# Patient Record
Sex: Male | Born: 1995 | Race: Black or African American | Hispanic: No | Marital: Single
Health system: Southern US, Community
[De-identification: ages and names within clinical notes are randomized; demographics above are authoritative.]

## PROBLEM LIST (undated history)

## (undated) HISTORY — PX: WISDOM TOOTH EXTRACTION: SHX21

## (undated) HISTORY — PX: APPENDECTOMY: SHX54

---
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
## 2016-02-23 ENCOUNTER — Encounter (HOSPITAL_COMMUNITY): Payer: Self-pay | Admitting: Emergency Medicine

## 2016-02-23 ENCOUNTER — Ambulatory Visit (HOSPITAL_COMMUNITY)
Admission: EM | Admit: 2016-02-23 | Discharge: 2016-02-23 | Disposition: A | Discharge status: Home / Self Care | Payer: Medicaid Other | Attending: Family Medicine | Admitting: Family Medicine

## 2016-02-23 ENCOUNTER — Ambulatory Visit (INDEPENDENT_AMBULATORY_CARE_PROVIDER_SITE_OTHER): Payer: Medicaid Other

## 2016-02-23 DIAGNOSIS — J069 Acute upper respiratory infection, unspecified: Secondary | ICD-10-CM

## 2016-02-23 DIAGNOSIS — B9789 Other viral agents as the cause of diseases classified elsewhere: Secondary | ICD-10-CM

## 2016-02-23 DIAGNOSIS — J40 Bronchitis, not specified as acute or chronic: Secondary | ICD-10-CM | POA: Diagnosis not present

## 2016-02-23 MED ORDER — IPRATROPIUM BROMIDE 0.02 % IN SOLN
RESPIRATORY_TRACT | Status: AC
  Filled 2016-02-23: qty 2.5

## 2016-02-23 MED ORDER — ALBUTEROL SULFATE (2.5 MG/3ML) 0.083% IN NEBU
5.0000 mg | INHALATION_SOLUTION | Freq: Once | RESPIRATORY_TRACT | Status: AC
  Administered 2016-02-23: 5 mg via RESPIRATORY_TRACT

## 2016-02-23 MED ORDER — ALBUTEROL SULFATE HFA 108 (90 BASE) MCG/ACT IN AERS: 1.0000 | INHALATION_SPRAY | Freq: Four times a day (QID) | RESPIRATORY_TRACT | 0 refills | Status: DC | PRN

## 2016-02-23 MED ORDER — IPRATROPIUM BROMIDE 0.06 % NA SOLN: 2.0000 | Freq: Four times a day (QID) | NASAL | 12 refills | Status: DC

## 2016-02-23 MED ORDER — ALBUTEROL SULFATE (2.5 MG/3ML) 0.083% IN NEBU
INHALATION_SOLUTION | RESPIRATORY_TRACT | Status: AC
  Filled 2016-02-23: qty 6

## 2016-02-23 MED ORDER — IPRATROPIUM BROMIDE 0.02 % IN SOLN
0.5000 mg | Freq: Once | RESPIRATORY_TRACT | Status: AC
  Administered 2016-02-23: 0.5 mg via RESPIRATORY_TRACT

## 2016-02-23 NOTE — ED Provider Notes (Signed)
CSN: [id number]     Arrival date & time 02/23/16  1013 History   First MD Initiated Contact with Patient 02/23/16 1152     Chief Complaint  Patient presents with  . Cough   (Consider location/radiation/quality/duration/timing/severity/associated sxs/prior Treatment) The history is provided by the patient.  Cough  Cough characteristics:  Productive, nocturnal and hoarse Sputum characteristics:  Yellow Severity:  Moderate Onset quality:  Gradual Duration:  3 days Timing:  Intermittent Progression:  Worsening Chronicity:  Recurrent Smoker: yes   Context: sick contacts and upper respiratory infection   Relieved by:  None tried Worsened by:  Lying down Ineffective treatments:  None tried Associated symptoms: chills, fever, rhinorrhea, shortness of breath, sinus congestion and wheezing   Associated symptoms: no ear pain, no eye discharge and no sore throat   Subjective fevers. Pt admits to h/o recurrent bronchitis. Pt is also a smoker.   History reviewed. No pertinent past medical history. Past Surgical History:  Procedure Laterality Date  . APPENDECTOMY    . APPENDECTOMY    . WISDOM TOOTH EXTRACTION     History reviewed. No pertinent family history. Social History  Substance Use Topics  . Smoking status: Passive Smoke Exposure - Never Smoker  . Smokeless tobacco: Never Used  . Alcohol use No    Review of Systems  Constitutional: Positive for chills and fever. Negative for activity change and appetite change.  HENT: Positive for congestion, dental problem, rhinorrhea and sneezing. Negative for ear discharge, ear pain, facial swelling and sore throat.   Eyes: Negative for discharge.  Respiratory: Positive for cough, shortness of breath and wheezing. Negative for chest tightness.   Gastrointestinal: Positive for vomiting.  Endocrine: Negative.   Genitourinary: Negative.   Psychiatric/Behavioral: Negative.     Allergies  Patient has no known allergies.  Home Medications    Prior to Admission medications   Medication Sig Start Date End Date Taking? Authorizing Provider  albuterol (PROVENTIL HFA;VENTOLIN HFA) 108 (90 Base) MCG/ACT inhaler Inhale 1-2 puffs into the lungs every 6 (six) hours as needed for wheezing or shortness of breath (cough). 02/23/16   Roma KayserKatherine P Hildegard Hlavac, NP  amoxicillin (AMOXIL) 500 MG capsule Take 1 capsule (500 mg total) by mouth 3 (three) times daily. 05/20/14   Marcellina Millinimothy Galey, MD  DiphenhydrAMINE HCl (BENADRYL PO) Take by mouth.    Historical Provider, MD  HYDROcodone-acetaminophen (NORCO/VICODIN) 5-325 MG per tablet Take 1 tablet by mouth every 6 (six) hours as needed for moderate pain.    Historical Provider, MD  hydrOXYzine (ATARAX/VISTARIL) 25 MG tablet Take 1 tablet (25 mg total) by mouth every 6 (six) hours. For itching 09/01/12   Linna HoffJames D Kindl, MD  ipratropium (ATROVENT) 0.06 % nasal spray Place 2 sprays into both nostrils 4 (four) times daily. 02/23/16   Roma KayserKatherine P Ernestine Rohman, NP  predniSONE (DELTASONE) 20 MG tablet Take 3 tablets (60 mg total) by mouth daily. 04/27/15   Santiago GladHeather Laisure, PA-C   Meds Ordered and Administered this Visit   Medications  albuterol (PROVENTIL) (2.5 MG/3ML) 0.083% nebulizer solution 5 mg (5 mg Nebulization Given 02/23/16 1223)  ipratropium (ATROVENT) nebulizer solution 0.5 mg (0.5 mg Nebulization Given 02/23/16 1223)    BP 135/67 (BP Location: Left Arm)   Pulse 98   Temp 98.4 F (36.9 C) (Oral)   Resp 16   SpO2 98%  No data found.   Physical Exam  Constitutional: He is oriented to person, place, and time. He appears well-developed and well-nourished.  HENT:  Head:  Normocephalic and atraumatic.  Right Ear: Tympanic membrane normal.  Left Ear: Tympanic membrane normal.  Nose: Rhinorrhea present.  Mouth/Throat: Uvula is midline, oropharynx is clear and moist and mucous membranes are normal.  Eyes: Conjunctivae are normal.  Cardiovascular: Normal rate.   Pulmonary/Chest: No respiratory distress. He has  wheezes.  Cough w/ deep inspiration.  Lymphadenopathy:    He has cervical adenopathy.  Neurological: He is alert and oriented to person, place, and time.  Skin: Skin is warm and dry.  Psychiatric: He has a normal mood and affect.    Urgent Care Course   Clinical Course     Procedures (including critical care time)  Labs Review Labs Reviewed - No data to display  Imaging Review Dg Chest 2 View  Result Date: 02/23/2016 CLINICAL DATA:  Two day history of cough and chest congestion. Wheezing. EXAM: CHEST  2 VIEW COMPARISON:  Two-view chest x-ray 04/27/2015 FINDINGS: The heart size and mediastinal contours are within normal limits. Both lungs are clear. The visualized skeletal structures are unremarkable. IMPRESSION: Negative two view chest x-ray Electronically Signed   By: Marin Robertshristopher  Mattern M.D.   On: 02/23/2016 12:21     Visual Acuity Review  Right Eye Distance:   Left Eye Distance:   Bilateral Distance:    Right Eye Near:   Left Eye Near:    Bilateral Near:         MDM   1. Bronchitis   2. Viral upper respiratory tract infection   Significant insp/exp wheezes on exam. Much improved after alb/atrovent neb. CCR negative for acute findings. Albuterol inhlaer Intranasal Atrovent Home care and smoking cessation discussed and provided in print.     Leanne ChangKatherine P Lonia Roane, NP 02/23/16 1324

## 2016-02-23 NOTE — ED Triage Notes (Signed)
[facility] a complaint of a cough that is productive and chest congestion that is worse at night that all started 3 days ago.

## 2016-07-06 ENCOUNTER — Emergency Department (HOSPITAL_COMMUNITY)
Admission: EM | Admit: 2016-07-06 | Discharge: 2016-07-06 | Disposition: A | Discharge status: Home / Self Care | Payer: Medicaid Other | Attending: Emergency Medicine | Admitting: Emergency Medicine

## 2016-07-06 ENCOUNTER — Encounter (HOSPITAL_COMMUNITY): Payer: Self-pay

## 2016-07-06 DIAGNOSIS — Z7722 Contact with and (suspected) exposure to environmental tobacco smoke (acute) (chronic): Secondary | ICD-10-CM | POA: Insufficient documentation

## 2016-07-06 DIAGNOSIS — R112 Nausea with vomiting, unspecified: Secondary | ICD-10-CM | POA: Insufficient documentation

## 2016-07-06 DIAGNOSIS — R197 Diarrhea, unspecified: Secondary | ICD-10-CM | POA: Insufficient documentation

## 2016-07-06 LAB — COMPREHENSIVE METABOLIC PANEL
ALT: 18 U/L (ref 17–63)
ANION GAP: 8 (ref 5–15)
AST: 23 U/L (ref 15–41)
Albumin: 4.4 g/dL (ref 3.5–5.0)
Alkaline Phosphatase: 69 U/L (ref 38–126)
BILIRUBIN TOTAL: 1.4 mg/dL — AB (ref 0.3–1.2)
BUN: 7 mg/dL (ref 6–20)
CO2: 26 mmol/L (ref 22–32)
Calcium: 9.5 mg/dL (ref 8.9–10.3)
Chloride: 104 mmol/L (ref 101–111)
Creatinine, Ser: 1.05 mg/dL (ref 0.61–1.24)
GFR calc Af Amer: >60 mL/min (ref >60)
GFR calc non Af Amer: >60 mL/min (ref >60)
GLUCOSE: 93 mg/dL (ref 65–99)
POTASSIUM: 4 mmol/L (ref 3.5–5.1)
Sodium: 138 mmol/L (ref 135–145)
Total Protein: 7.1 g/dL (ref 6.5–8.1)

## 2016-07-06 LAB — LIPASE, BLOOD: LIPASE: 27 U/L (ref 11–51)

## 2016-07-06 LAB — CBC
HCT: 47.4 % (ref 39.0–52.0)
Hemoglobin: 16.3 g/dL (ref 13.0–17.0)
MCH: 31.1 pg (ref 26.0–34.0)
MCHC: 34.4 g/dL (ref 30.0–36.0)
MCV: 90.5 fL (ref 78.0–100.0)
PLATELETS: 247 10*3/uL (ref 150–400)
RBC: 5.24 MIL/uL (ref 4.22–5.81)
RDW: 12.5 % (ref 11.5–15.5)
WBC: 4.9 10*3/uL (ref 4.0–10.5)

## 2016-07-06 MED ORDER — ONDANSETRON 4 MG PO TBDP: 4.0000 mg | ORAL_TABLET | Freq: Three times a day (TID) | ORAL | 0 refills | Status: DC | PRN

## 2016-07-06 MED ORDER — ONDANSETRON 4 MG PO TBDP
4.0000 mg | ORAL_TABLET | Freq: Once | ORAL | Status: AC
  Administered 2016-07-06: 4 mg via ORAL
  Filled 2016-07-06: qty 1

## 2016-07-06 NOTE — ED Triage Notes (Signed)
Patient here with lower abdominal cramping, diarrhea, generalized headache x 1 day. No nausea, no vomiting, NAD

## 2016-07-06 NOTE — ED Provider Notes (Signed)
[facility]-EMERGENCY DEPT Provider Note   CSN: [id number] Arrival date & time: 07/06/16  4010     History   Chief Complaint Chief Complaint  Patient presents with  . Headache  . Diarrhea    HPI Ryan Hodges is a 21 y.o. male.  21 yo M with a chief complaint of nausea vomiting and diarrhea. Started yesterday. Patient denies suspicious food intake. Subjective fevers at home. Mild headache with this as well. Has been able tolerate by mouth without difficulty. Diffuse crampy abdominal pain. His mother gave him a glass of prune juice which made her cramping worse.   The history is provided by the patient.  Headache   Associated symptoms include nausea and vomiting. Pertinent negatives include no fever, no palpitations and no shortness of breath.  Diarrhea   Associated symptoms include vomiting. Pertinent negatives include no abdominal pain, no chills, no headaches, no arthralgias and no myalgias.  Illness  This is a new problem. The current episode started yesterday. The problem occurs constantly. The problem has not changed since onset.Pertinent negatives include no chest pain, no abdominal pain, no headaches and no shortness of breath. Nothing aggravates the symptoms. Nothing relieves the symptoms. He has tried nothing for the symptoms. The treatment provided no relief.    History reviewed. No pertinent past medical history.  Patient Active Problem List   Diagnosis Date Noted  . ALLERGIC RHINITIS, SEASONAL 06/29/2008  . OBESITY, NOS 05/15/2006    Past Surgical History:  Procedure Laterality Date  . APPENDECTOMY    . APPENDECTOMY    . WISDOM TOOTH EXTRACTION         Home Medications    Prior to Admission medications   Medication Sig Start Date End Date Taking? Authorizing Provider  albuterol (PROVENTIL HFA;VENTOLIN HFA) 108 (90 Base) MCG/ACT inhaler Inhale 1-2 puffs into the lungs every 6 (six) hours as needed for wheezing or shortness of breath (cough). 02/23/16    Roma Kayser Schorr, NP  amoxicillin (AMOXIL) 500 MG capsule Take 1 capsule (500 mg total) by mouth 3 (three) times daily. 05/20/14   Marcellina Millin, MD  DiphenhydrAMINE HCl (BENADRYL PO) Take by mouth.    Historical Provider, MD  HYDROcodone-acetaminophen (NORCO/VICODIN) 5-325 MG per tablet Take 1 tablet by mouth every 6 (six) hours as needed for moderate pain.    Historical Provider, MD  hydrOXYzine (ATARAX/VISTARIL) 25 MG tablet Take 1 tablet (25 mg total) by mouth every 6 (six) hours. For itching 09/01/12   Linna Hoff, MD  ipratropium (ATROVENT) 0.06 % nasal spray Place 2 sprays into both nostrils 4 (four) times daily. 02/23/16   Roma Kayser Schorr, NP  ondansetron (ZOFRAN ODT) 4 MG disintegrating tablet Take 1 tablet (4 mg total) by mouth every 8 (eight) hours as needed for nausea or vomiting. 07/06/16   Melene Plan, DO  predniSONE (DELTASONE) 20 MG tablet Take 3 tablets (60 mg total) by mouth daily. 04/27/15   Santiago Glad, PA-C    Family History No family history on file.  Social History Social History  Substance Use Topics  . Smoking status: Passive Smoke Exposure - Never Smoker  . Smokeless tobacco: Never Used  . Alcohol use No     Allergies   Patient has no known allergies.   Review of Systems Review of Systems  Constitutional: Negative for chills and fever.  HENT: Negative for congestion and facial swelling.   Eyes: Negative for discharge and visual disturbance.  Respiratory: Negative for shortness of breath.  Cardiovascular: Negative for chest pain and palpitations.  Gastrointestinal: Positive for diarrhea, nausea and vomiting. Negative for abdominal pain.  Musculoskeletal: Negative for arthralgias and myalgias.  Skin: Negative for color change and rash.  Neurological: Negative for tremors, syncope and headaches.  Psychiatric/Behavioral: Negative for confusion and dysphoric mood.     Physical Exam Updated Vital Signs BP 126/77   Pulse 80   Temp 97.9 F (36.6 C)  (Oral)   Resp 18   SpO2 100%   Physical Exam  Constitutional: He is oriented to person, place, and time. He appears well-developed and well-nourished.  HENT:  Head: Normocephalic and atraumatic.  Eyes: EOM are normal. Pupils are equal, round, and reactive to light.  Neck: Normal range of motion. Neck supple. No JVD present.  Cardiovascular: Normal rate and regular rhythm.  Exam reveals no gallop and no friction rub.   No murmur heard. Pulmonary/Chest: No respiratory distress. He has no wheezes.  Abdominal: He exhibits no distension and no mass. There is tenderness (mild diffuse). There is no rebound and no guarding.  Musculoskeletal: Normal range of motion.  Neurological: He is alert and oriented to person, place, and time.  Skin: No rash noted. No pallor.  Psychiatric: He has a normal mood and affect. His behavior is normal.  Nursing note and vitals reviewed.    ED Treatments / Results  Labs (all labs ordered are listed, but only abnormal results are displayed) Labs Reviewed  COMPREHENSIVE METABOLIC PANEL - Abnormal; Notable for the following:       Result Value   Total Bilirubin 1.4 (*)    All other components within normal limits  LIPASE, BLOOD  CBC    EKG  EKG Interpretation None       Radiology No results found.  Procedures Procedures (including critical care time)  Medications Ordered in ED Medications  ondansetron (ZOFRAN-ODT) disintegrating tablet 4 mg (not administered)     Initial Impression / Assessment and Plan / ED Course  I have reviewed the triage vital signs and the nursing notes.  Pertinent labs & imaging results that were available during my care of the patient were reviewed by me and considered in my medical decision making (see chart for details).     21 yo M With a chief complaint of nausea vomiting and diarrhea. Nonfocal abdominal exam. Will obtain labs give Zofran. Suspect likely viral illness will DC home with prescription for Zofran  and Imodium  10:12 AM:  I have discussed the diagnosis/risks/treatment options with the patient and believe the pt to be eligible for discharge home to follow-up with PCP. We also discussed returning to the ED immediately if new or worsening sx occur. We discussed the sx which are most concerning (e.g., sudden worsening pain, fever, inability to tolerate by mouth) that necessitate immediate return. Medications administered to the patient during their visit and any new prescriptions provided to the patient are listed below.  Medications given during this visit Medications  ondansetron (ZOFRAN-ODT) disintegrating tablet 4 mg (not administered)     The patient appears reasonably screen and/or stabilized for discharge and I doubt any other medical condition or other [facility] requiring further screening, evaluation, or treatment in the ED at this time prior to discharge.    Final Clinical Impressions(s) / ED Diagnoses   Final diagnoses:  Nausea vomiting and diarrhea    New Prescriptions New Prescriptions   ONDANSETRON (ZOFRAN ODT) 4 MG DISINTEGRATING TABLET    Take 1 tablet (4 mg total) by mouth  every 8 (eight) hours as needed for nausea or vomiting.     Melene Plan, DO 07/06/16 1012

## 2016-09-29 IMAGING — DX DG CHEST 2V
2 series · 2 of 2 positions shown · non-contrast
Comparison: 09/28/2013

CLINICAL DATA: Cough, congestion, fever, and chest pain for 2 days

EXAM:
CHEST  2 VIEW

[w chest pa]
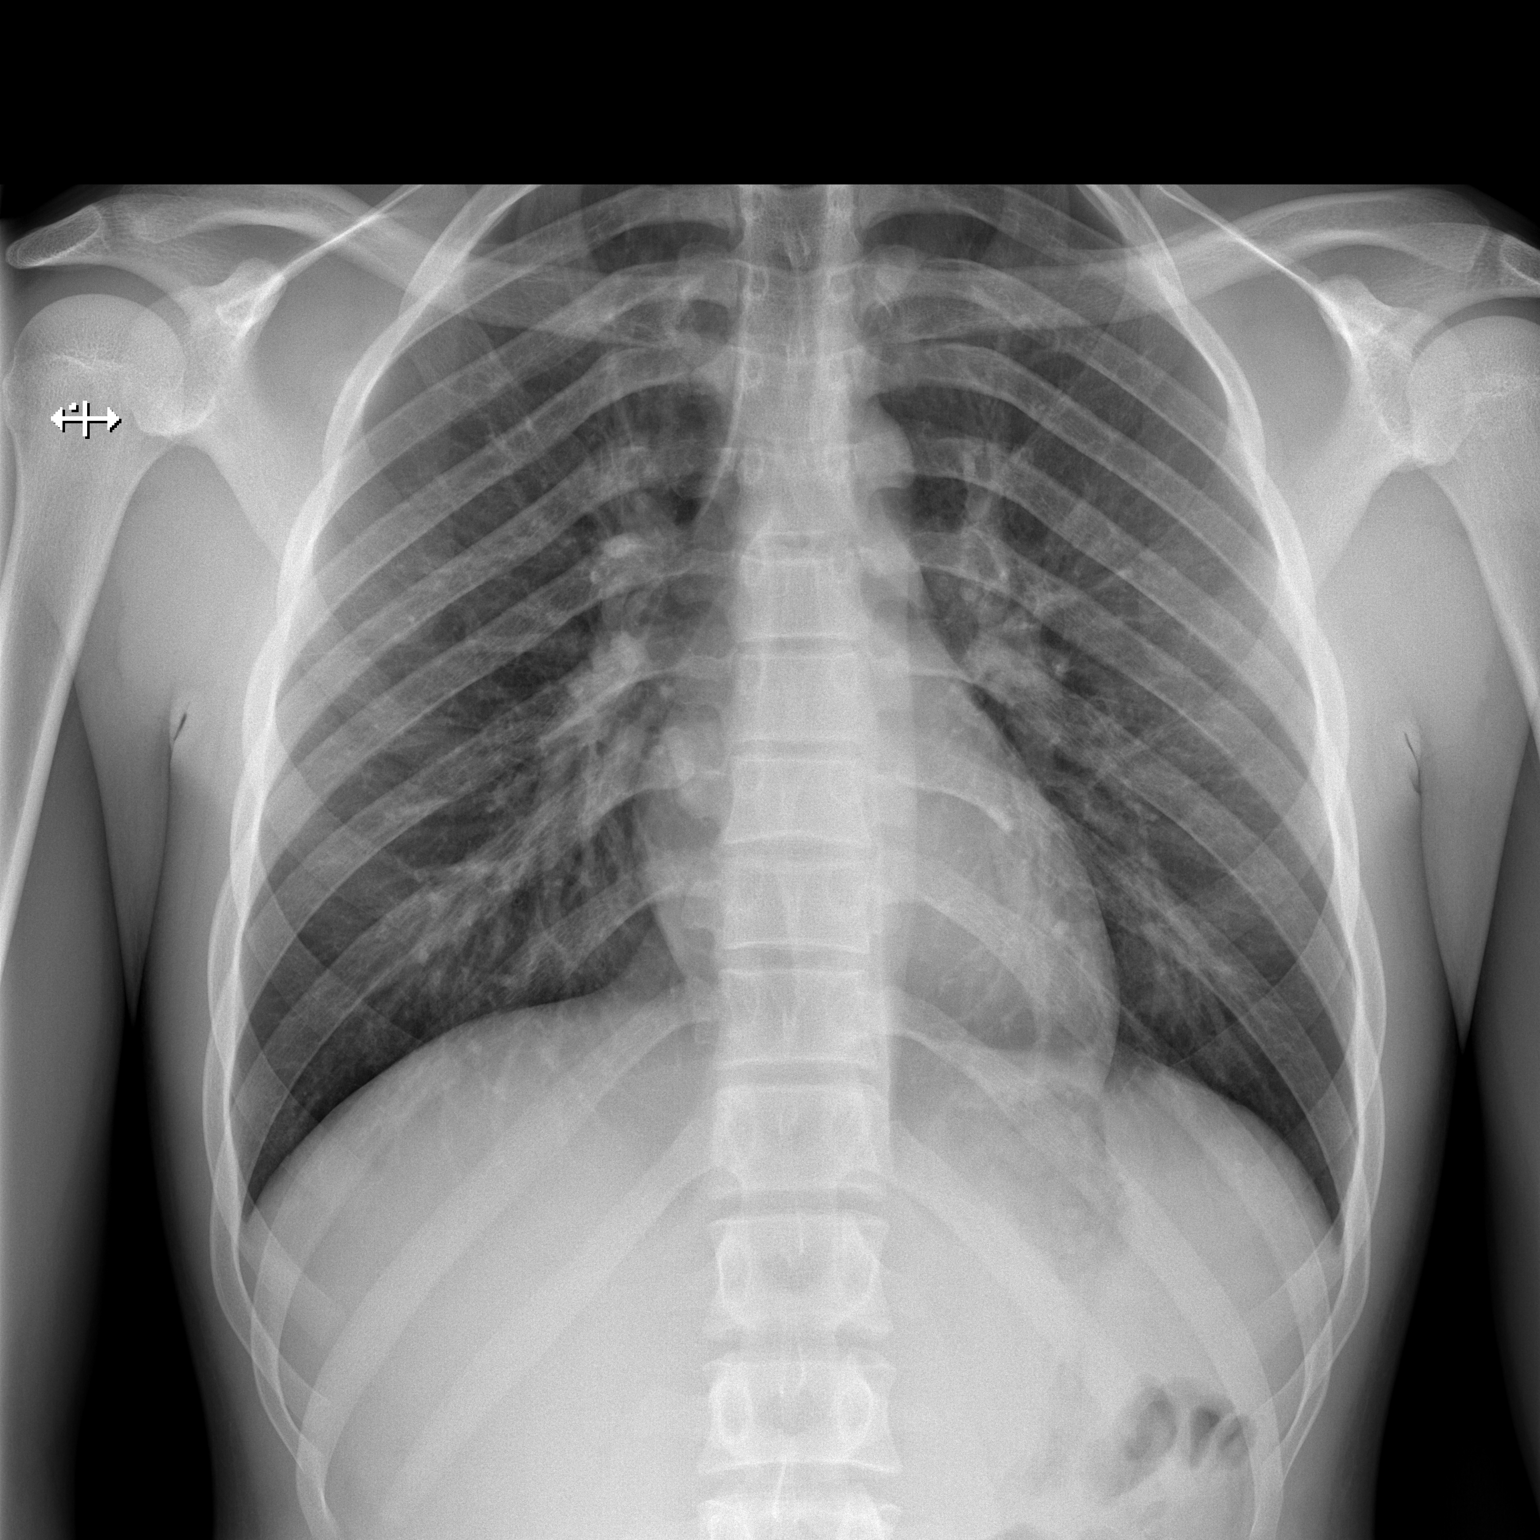

[w chest lat]
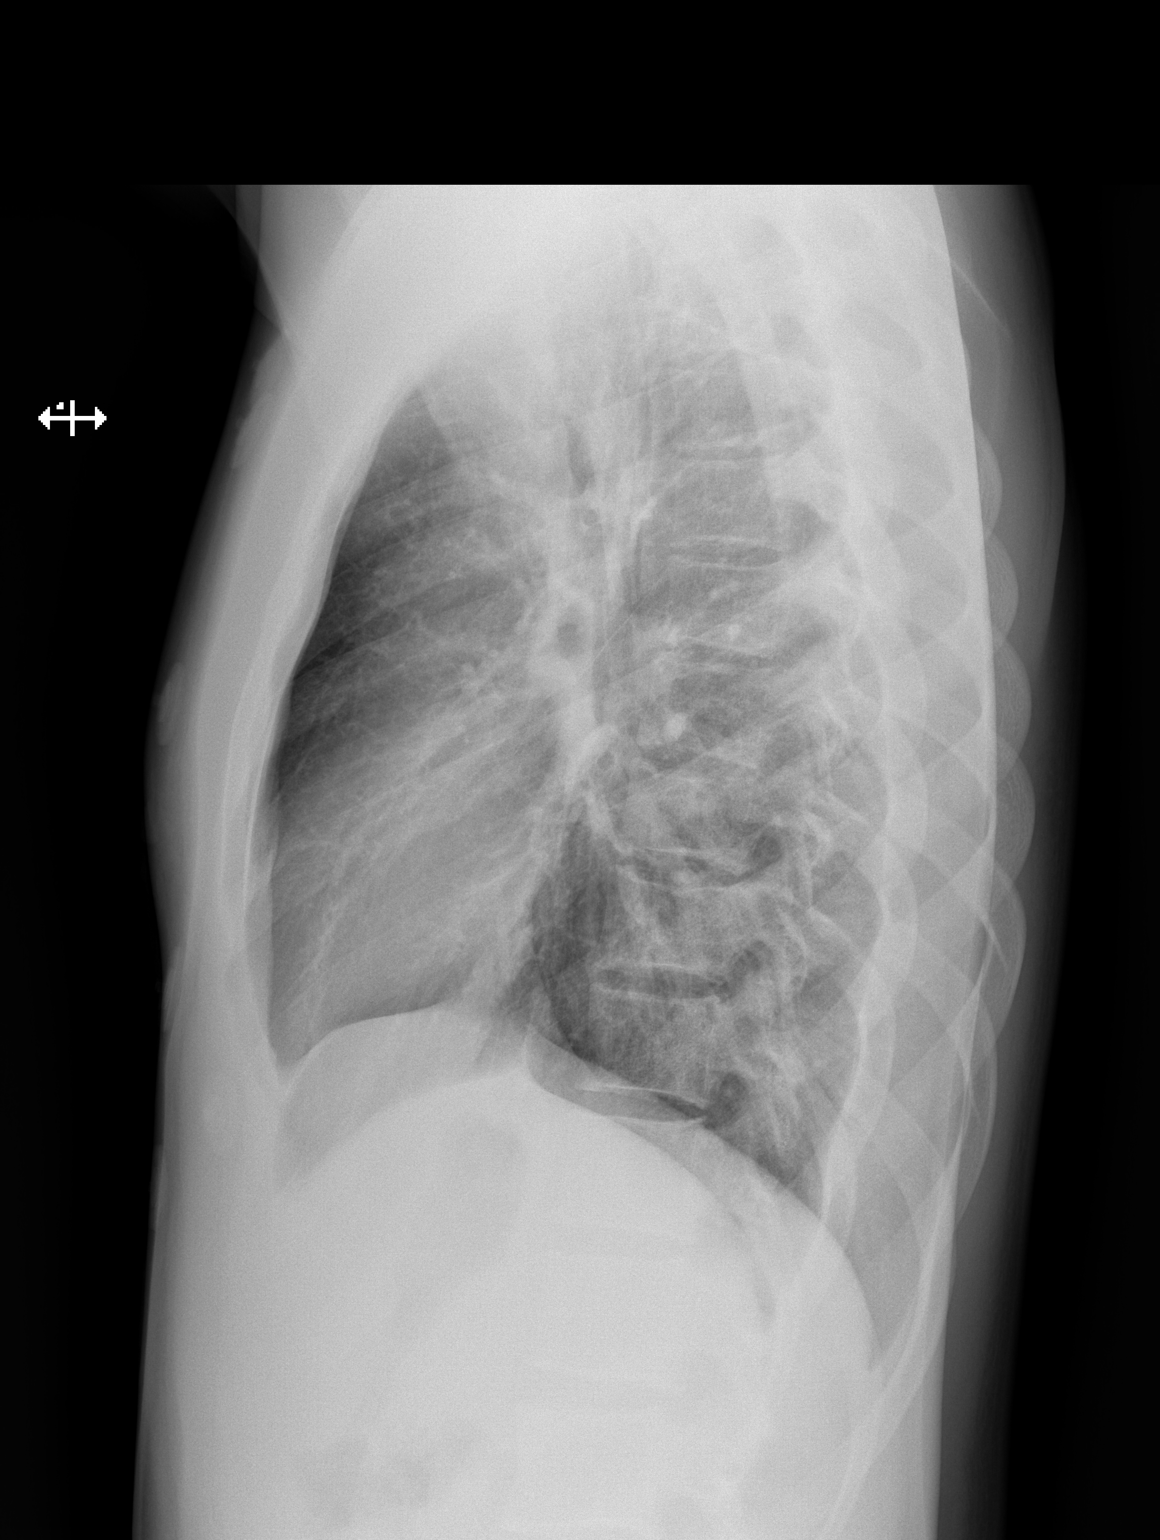

[2 of 2 positions shown; findings below may reference images not displayed]

FINDINGS: Normal heart size, mediastinal contours, and pulmonary vascularity.

Lungs clear.

No pneumothorax.

Bones unremarkable.
IMPRESSION: Normal exam.

## 2017-06-13 ENCOUNTER — Emergency Department (HOSPITAL_COMMUNITY)
Admission: EM | Admit: 2017-06-13 | Discharge: 2017-06-13 | Disposition: A | Discharge status: Home / Self Care | Payer: Medicaid Other | Attending: Emergency Medicine | Admitting: Emergency Medicine

## 2017-06-13 ENCOUNTER — Ambulatory Visit (HOSPITAL_COMMUNITY)
Admission: EM | Admit: 2017-06-13 | Discharge: 2017-06-13 | Disposition: A | Discharge status: Home / Self Care | Payer: Medicaid Other | Attending: Emergency Medicine | Admitting: Emergency Medicine

## 2017-06-13 ENCOUNTER — Other Ambulatory Visit: Payer: Self-pay

## 2017-06-13 ENCOUNTER — Encounter (HOSPITAL_COMMUNITY): Payer: Self-pay

## 2017-06-13 ENCOUNTER — Encounter (HOSPITAL_COMMUNITY): Payer: Self-pay | Admitting: Emergency Medicine

## 2017-06-13 DIAGNOSIS — R51 Headache: Secondary | ICD-10-CM | POA: Diagnosis present

## 2017-06-13 DIAGNOSIS — Z79899 Other long term (current) drug therapy: Secondary | ICD-10-CM | POA: Insufficient documentation

## 2017-06-13 DIAGNOSIS — Z7722 Contact with and (suspected) exposure to environmental tobacco smoke (acute) (chronic): Secondary | ICD-10-CM | POA: Diagnosis not present

## 2017-06-13 DIAGNOSIS — R112 Nausea with vomiting, unspecified: Secondary | ICD-10-CM | POA: Insufficient documentation

## 2017-06-13 DIAGNOSIS — R519 Headache, unspecified: Secondary | ICD-10-CM

## 2017-06-13 DIAGNOSIS — R197 Diarrhea, unspecified: Secondary | ICD-10-CM | POA: Insufficient documentation

## 2017-06-13 LAB — COMPREHENSIVE METABOLIC PANEL
ALK PHOS: 73 U/L (ref 38–126)
ALT: 57 U/L (ref 17–63)
AST: 33 U/L (ref 15–41)
Albumin: 4.4 g/dL (ref 3.5–5.0)
Anion gap: 9 (ref 5–15)
BILIRUBIN TOTAL: 0.9 mg/dL (ref 0.3–1.2)
BUN: 8 mg/dL (ref 6–20)
CALCIUM: 9.3 mg/dL (ref 8.9–10.3)
CO2: 26 mmol/L (ref 22–32)
CREATININE: 0.95 mg/dL (ref 0.61–1.24)
Chloride: 104 mmol/L (ref 101–111)
GFR calc non Af Amer: >60 mL/min (ref >60)
Glucose, Bld: 94 mg/dL (ref 65–99)
Potassium: 4 mmol/L (ref 3.5–5.1)
SODIUM: 139 mmol/L (ref 135–145)
Total Protein: 7.3 g/dL (ref 6.5–8.1)

## 2017-06-13 LAB — CBC
HCT: 46.4 % (ref 39.0–52.0)
Hemoglobin: 16.2 g/dL (ref 13.0–17.0)
MCH: 31.7 pg (ref 26.0–34.0)
MCHC: 34.9 g/dL (ref 30.0–36.0)
MCV: 90.8 fL (ref 78.0–100.0)
PLATELETS: 207 10*3/uL (ref 150–400)
RBC: 5.11 MIL/uL (ref 4.22–5.81)
RDW: 11.9 % (ref 11.5–15.5)
WBC: 4.6 10*3/uL (ref 4.0–10.5)

## 2017-06-13 LAB — LIPASE, BLOOD: Lipase: 26 U/L (ref 11–51)

## 2017-06-13 LAB — URINALYSIS, ROUTINE W REFLEX MICROSCOPIC
BILIRUBIN URINE: NEGATIVE
Glucose, UA: NEGATIVE mg/dL
HGB URINE DIPSTICK: NEGATIVE
Ketones, ur: NEGATIVE mg/dL
Leukocytes, UA: NEGATIVE
Nitrite: NEGATIVE
Protein, ur: NEGATIVE mg/dL
Specific Gravity, Urine: 1.013 (ref 1.005–1.030)
pH: 7 (ref 5.0–8.0)

## 2017-06-13 MED ORDER — KETOROLAC TROMETHAMINE 30 MG/ML IJ SOLN
30.0000 mg | Freq: Once | INTRAMUSCULAR | Status: AC
  Administered 2017-06-13: 30 mg via INTRAVENOUS
  Filled 2017-06-13: qty 1

## 2017-06-13 MED ORDER — DIPHENHYDRAMINE HCL 50 MG/ML IJ SOLN
25.0000 mg | Freq: Once | INTRAMUSCULAR | Status: AC
  Administered 2017-06-13: 25 mg via INTRAVENOUS
  Filled 2017-06-13: qty 1

## 2017-06-13 MED ORDER — LORATADINE 10 MG PO TABS: 10.0000 mg | ORAL_TABLET | Freq: Every day | ORAL | 1 refills | Status: DC

## 2017-06-13 MED ORDER — NAPROXEN 500 MG PO TABS: 500.0000 mg | ORAL_TABLET | Freq: Two times a day (BID) | ORAL | 0 refills | Status: DC

## 2017-06-13 MED ORDER — PROCHLORPERAZINE EDISYLATE 5 MG/ML IJ SOLN
10.0000 mg | Freq: Once | INTRAMUSCULAR | Status: AC
  Administered 2017-06-13: 10 mg via INTRAVENOUS
  Filled 2017-06-13: qty 2

## 2017-06-13 MED ORDER — SODIUM CHLORIDE 0.9 % IV BOLUS
1000.0000 mL | Freq: Once | INTRAVENOUS | Status: AC
  Administered 2017-06-13: 1000 mL via INTRAVENOUS

## 2017-06-13 NOTE — Discharge Instructions (Signed)
Appears this is more sinus related  You will need to take allergy medications daily Can take motrin as needed for pain Stay hydrated  If sx be come worse go to the ER

## 2017-06-13 NOTE — ED Notes (Signed)
Bed: WA04 Expected date:  Expected time:  Means of arrival:  Comments: EMS 

## 2017-06-13 NOTE — Discharge Instructions (Addendum)
Return here as needed. Follow up with a primary doctor. °

## 2017-06-13 NOTE — ED Triage Notes (Signed)
Pt c/o headache x2 days. Pt also had stomach cramps, pt also c/o back ache, and the back of his neck has been sore.

## 2017-06-13 NOTE — ED Triage Notes (Signed)
Pt arrived via EMS from home. Pt is c/o HA x 3 days with associated N/V/D. Pt went to PCP this am, and was prescribed pain medicine today by PCP but was unable to pick to RX. Pt was given 4 mg of Zofran en route.   IV 18 G left AC  V/S BP 139/57 HR 60 RR 16  CBG 97   97% RA

## 2017-06-13 NOTE — ED Notes (Signed)
Pt is alert and oriented x 4 and is verbally responsive. Pt reports that he was seen at urgent care today and was dx with a sinus infection. Pt was unable to pick up RX because he started to vomit and his mother called EMS. Pt denies diarrhea.

## 2017-06-13 NOTE — ED Provider Notes (Signed)
[facility]    CSN: [id number] Arrival date & time: 06/13/17  1052     History   Chief Complaint Chief Complaint  Patient presents with  . Headache    HPI Ryan Hodges is a 22 y.o. male.   Pt states that he has had a generalized headache intermit for the past 3 days now, with nasal congestion. Pt states that he usually has sinus issues. Took Excedrin and it help with some nasal spray. Does not take anything on a daily biases for allergy's. Denies any blurred vision, no n/v     History reviewed. No pertinent past medical history.  Patient Active Problem List   Diagnosis Date Noted  . ALLERGIC RHINITIS, SEASONAL 06/29/2008  . OBESITY, NOS 05/15/2006    Past Surgical History:  Procedure Laterality Date  . APPENDECTOMY    . APPENDECTOMY    . WISDOM TOOTH EXTRACTION         Home Medications    Prior to Admission medications   Medication Sig Start Date End Date Taking? Authorizing Provider  albuterol (PROVENTIL HFA;VENTOLIN HFA) 108 (90 Base) MCG/ACT inhaler Inhale 1-2 puffs into the lungs every 6 (six) hours as needed for wheezing or shortness of breath (cough). 02/23/16   Schorr, Roma Kayser, NP  amoxicillin (AMOXIL) 500 MG capsule Take 1 capsule (500 mg total) by mouth 3 (three) times daily. Patient not taking: Reported on 06/13/2017 05/20/14   Marcellina Millin, MD  DiphenhydrAMINE HCl (BENADRYL PO) Take by mouth.    [provider]  HYDROcodone-acetaminophen (NORCO/VICODIN) 5-325 MG per tablet Take 1 tablet by mouth every 6 (six) hours as needed for moderate pain.    [provider]  hydrOXYzine (ATARAX/VISTARIL) 25 MG tablet Take 1 tablet (25 mg total) by mouth every 6 (six) hours. For itching Patient not taking: Reported on 06/13/2017 09/01/12   Linna Hoff, MD  ipratropium (ATROVENT) 0.06 % nasal spray Place 2 sprays into both nostrils 4 (four) times daily. Patient not taking: Reported on 06/13/2017 02/23/16   Schorr, Roma Kayser, NP    loratadine (CLARITIN) 10 MG tablet Take 1 tablet (10 mg total) by mouth daily. 06/13/17   Coralyn Mark, NP  naproxen (NAPROSYN) 500 MG tablet Take 1 tablet (500 mg total) by mouth 2 (two) times daily. 06/13/17   Coralyn Mark, NP  ondansetron (ZOFRAN ODT) 4 MG disintegrating tablet Take 1 tablet (4 mg total) by mouth every 8 (eight) hours as needed for nausea or vomiting. Patient not taking: Reported on 06/13/2017 07/06/16   Melene Plan, DO  predniSONE (DELTASONE) 20 MG tablet Take 3 tablets (60 mg total) by mouth daily. Patient not taking: Reported on 06/13/2017 04/27/15   Santiago Glad, PA-C    Family History No family history on file.  Social History Social History   Tobacco Use  . Smoking status: Passive Smoke Exposure - Never Smoker  . Smokeless tobacco: Never Used  Substance Use Topics  . Alcohol use: No  . Drug use: No     Allergies   Patient has no known allergies.   Review of Systems Review of Systems  HENT: Positive for sinus pressure and sinus pain.   Eyes: Positive for itching.  Respiratory: Negative.   Cardiovascular: Negative.   Gastrointestinal: Negative.   Neurological: Positive for headaches.     Physical Exam Triage Vital Signs ED Triage Vitals [06/13/17 1203]  Enc Vitals Group     BP 126/69     Pulse Rate 61  Resp 18     Temp 98.1 F (36.7 C)     Temp src      SpO2 100 %     Weight      Height      Head Circumference      Peak Flow      Pain Score      Pain Loc      Pain Edu?      Excl. in GC?    No data found.  Updated Vital Signs BP 126/69   Pulse 61   Temp 98.1 F (36.7 C)   Resp 18   SpO2 100%   Visual Acuity Right Eye Distance:   Left Eye Distance:   Bilateral Distance:    Right Eye Near:   Left Eye Near:    Bilateral Near:     Physical Exam  Constitutional: He appears well-developed.  HENT:  Head: Normocephalic.  Mouth/Throat: Oropharynx is clear and moist.  Clear post nasal drip,   Eyes: Pupils  are equal, round, and reactive to light.  Neck: Normal range of motion.  Cardiovascular: Normal rate and normal heart sounds.  Pulmonary/Chest: Effort normal.  Abdominal: Soft.  Neurological: He is alert. He has normal strength.  Skin: Skin is warm.     UC Treatments / Results  Labs (all labs ordered are listed, but only abnormal results are displayed) Labs Reviewed - No data to display  EKG None Radiology No results found.  Procedures Procedures (including critical care time)  Medications Ordered in UC Medications - No data to display   Initial Impression / Assessment and Plan / UC Course  I have reviewed the triage vital signs and the nursing notes.  Pertinent labs & imaging results that were available during my care of the patient were reviewed by me and considered in my medical decision making (see chart for details).     Appears this is more sinus related  You will need to take allergy medications daily Can take motrin as needed for pain Stay hydrated  If sx be come worse go to the ER    Final Clinical Impressions(s) / UC Diagnoses   Final diagnoses:  Sinus headache    ED Discharge Orders        Ordered    naproxen (NAPROSYN) 500 MG tablet  2 times daily     06/13/17 1224    loratadine (CLARITIN) 10 MG tablet  Daily     06/13/17 1224       Controlled Substance Prescriptions [place] Controlled Substance Registry consulted? Not Applicable   Coralyn MarkMitchell, Jeyson Deshotel L, NP 06/13/17 1224

## 2017-06-14 NOTE — ED Provider Notes (Signed)
[facility]-EMERGENCY DEPT Provider Note   CSN: [id number] Arrival date & time: 06/13/17  1507     History   Chief Complaint Chief Complaint  Patient presents with  . Nausea  . Emesis  . Migraine    HPI [name] is a 22 y.o. male.  HPI Patient presents to the emergency department with headache that started 2 days ago.  The patient states that he has photophobia nausea vomiting with throbbing headache.  The patient states that he had one type of headache like this in the past.  Patient states that he took over-the-counter Excedrin without relief of the symptoms.  The patient states that nothing seemed to make the condition worse.  The patient denies chest pain, shortness of breath, blurred vision, neck pain, fever, cough, weakness, numbness, dizziness, anorexia, edema, abdominal pain, nausea, vomiting, diarrhea, rash, back pain, dysuria, hematemesis, bloody stool, near syncope, or syncope.   No past medical history on file.  Patient Active Problem List   Diagnosis Date Noted  . ALLERGIC RHINITIS, SEASONAL 06/29/2008  . OBESITY, NOS 05/15/2006    Past Surgical History:  Procedure Laterality Date  . APPENDECTOMY    . APPENDECTOMY    . WISDOM TOOTH EXTRACTION          Home Medications    Prior to Admission medications   Medication Sig Start Date End Date Taking? Authorizing Provider  aspirin-acetaminophen-caffeine (EXCEDRIN MIGRAINE) [phone] MG tablet Take 1 tablet by mouth every 6 (six) hours as needed for headache.   Yes [provider]  ibuprofen (ADVIL,MOTRIN) 200 MG tablet Take 200 mg by mouth every 6 (six) hours as needed for headache.   Yes [provider]  albuterol (PROVENTIL HFA;VENTOLIN HFA) 108 (90 Base) MCG/ACT inhaler Inhale 1-2 puffs into the lungs every 6 (six) hours as needed for wheezing or shortness of breath (cough). 02/23/16   Schorr, Roma KayserKatherine P, NP  amoxicillin (AMOXIL) 500 MG capsule Take 1 capsule  (500 mg total) by mouth 3 (three) times daily. Patient not taking: Reported on 06/13/2017 05/20/14   Marcellina MillinGaley, Timothy, MD  hydrOXYzine (ATARAX/VISTARIL) 25 MG tablet Take 1 tablet (25 mg total) by mouth every 6 (six) hours. For itching Patient not taking: Reported on 06/13/2017 09/01/12   Linna HoffKindl, James D, MD  ipratropium (ATROVENT) 0.06 % nasal spray Place 2 sprays into both nostrils 4 (four) times daily. Patient not taking: Reported on 06/13/2017 02/23/16   Schorr, Roma KayserKatherine P, NP  loratadine (CLARITIN) 10 MG tablet Take 1 tablet (10 mg total) by mouth daily. 06/13/17   Coralyn MarkMitchell, Melanie L, NP  naproxen (NAPROSYN) 500 MG tablet Take 1 tablet (500 mg total) by mouth 2 (two) times daily. 06/13/17   Coralyn MarkMitchell, Melanie L, NP  ondansetron (ZOFRAN ODT) 4 MG disintegrating tablet Take 1 tablet (4 mg total) by mouth every 8 (eight) hours as needed for nausea or vomiting. Patient not taking: Reported on 06/13/2017 07/06/16   Melene PlanFloyd, Dan, DO  predniSONE (DELTASONE) 20 MG tablet Take 3 tablets (60 mg total) by mouth daily. Patient not taking: Reported on 06/13/2017 04/27/15   Santiago GladLaisure, Heather, PA-C    Family History History reviewed. No pertinent family history.  Social History Social History   Tobacco Use  . Smoking status: Passive Smoke Exposure - Never Smoker  . Smokeless tobacco: Never Used  Substance Use Topics  . Alcohol use: No  . Drug use: No     Allergies   Patient has no known allergies.   Review  of Systems Review of Systems  All other systems negative except as documented in the HPI. All pertinent positives and negatives as reviewed in the HPI. Physical Exam Updated Vital Signs BP 124/74 (BP Location: Left Arm)   Pulse (!) 55   Temp 98.2 F (36.8 C) (Oral)   Resp 18   SpO2 100%   Physical Exam  Constitutional: He is oriented to person, place, and time. He appears well-developed and well-nourished. No distress.  HENT:  Head: Normocephalic and atraumatic.  Mouth/Throat: Oropharynx is  clear and moist.  Eyes: Pupils are equal, round, and reactive to light.  Neck: Normal range of motion. Neck supple.  Cardiovascular: Normal rate, regular rhythm and normal heart sounds. Exam reveals no gallop and no friction rub.  No murmur heard. Pulmonary/Chest: Effort normal and breath sounds normal. No respiratory distress. He has no wheezes.  Neurological: He is alert and oriented to person, place, and time. He exhibits normal muscle tone. Coordination normal.  Skin: Skin is warm and dry. Capillary refill takes less than 2 seconds. No rash noted. No erythema.  Psychiatric: He has a normal mood and affect. His behavior is normal.  Nursing note and vitals reviewed.    ED Treatments / Results  Labs (all labs ordered are listed, but only abnormal results are displayed) Labs Reviewed  LIPASE, BLOOD  COMPREHENSIVE METABOLIC PANEL  CBC  URINALYSIS, ROUTINE W REFLEX MICROSCOPIC    EKG None  Radiology No results found.  Procedures Procedures (including critical care time)  Medications Ordered in ED Medications  sodium chloride 0.9 % bolus 1,000 mL (0 mLs Intravenous Stopped 06/13/17 1844)  prochlorperazine (COMPAZINE) injection 10 mg (10 mg Intravenous Given 06/13/17 1646)  diphenhydrAMINE (BENADRYL) injection 25 mg (25 mg Intravenous Given 06/13/17 1646)  ketorolac (TORADOL) 30 MG/ML injection 30 mg (30 mg Intravenous Given 06/13/17 1646)     Initial Impression / Assessment and Plan / ED Course  I have reviewed the triage vital signs and the nursing notes.  Pertinent labs & imaging results that were available during my care of the patient were reviewed by me and considered in my medical decision making (see chart for details).      The patient is feeling no symptoms at this time following the migraine cocktail and IV fluids.  The patient is the patient is patient is advised to return here as needed.  Patient is advised to increase his fluid intake and rest as much as  possible.   Final Clinical Impressions(s) / ED Diagnoses   Final diagnoses:  Nausea vomiting and diarrhea    ED Discharge Orders    None       Charlestine Night, PA-C 06/14/17 0009    Charlynne Pander, MD 06/14/17 1430

## 2017-07-28 IMAGING — DX DG CHEST 2V
2 series · 2 of 2 positions shown · non-contrast
Comparison: Two-view chest x-ray 04/27/2015

CLINICAL DATA: Two day history of cough and chest congestion.
Wheezing.

EXAM:
CHEST  2 VIEW

[chest pa]
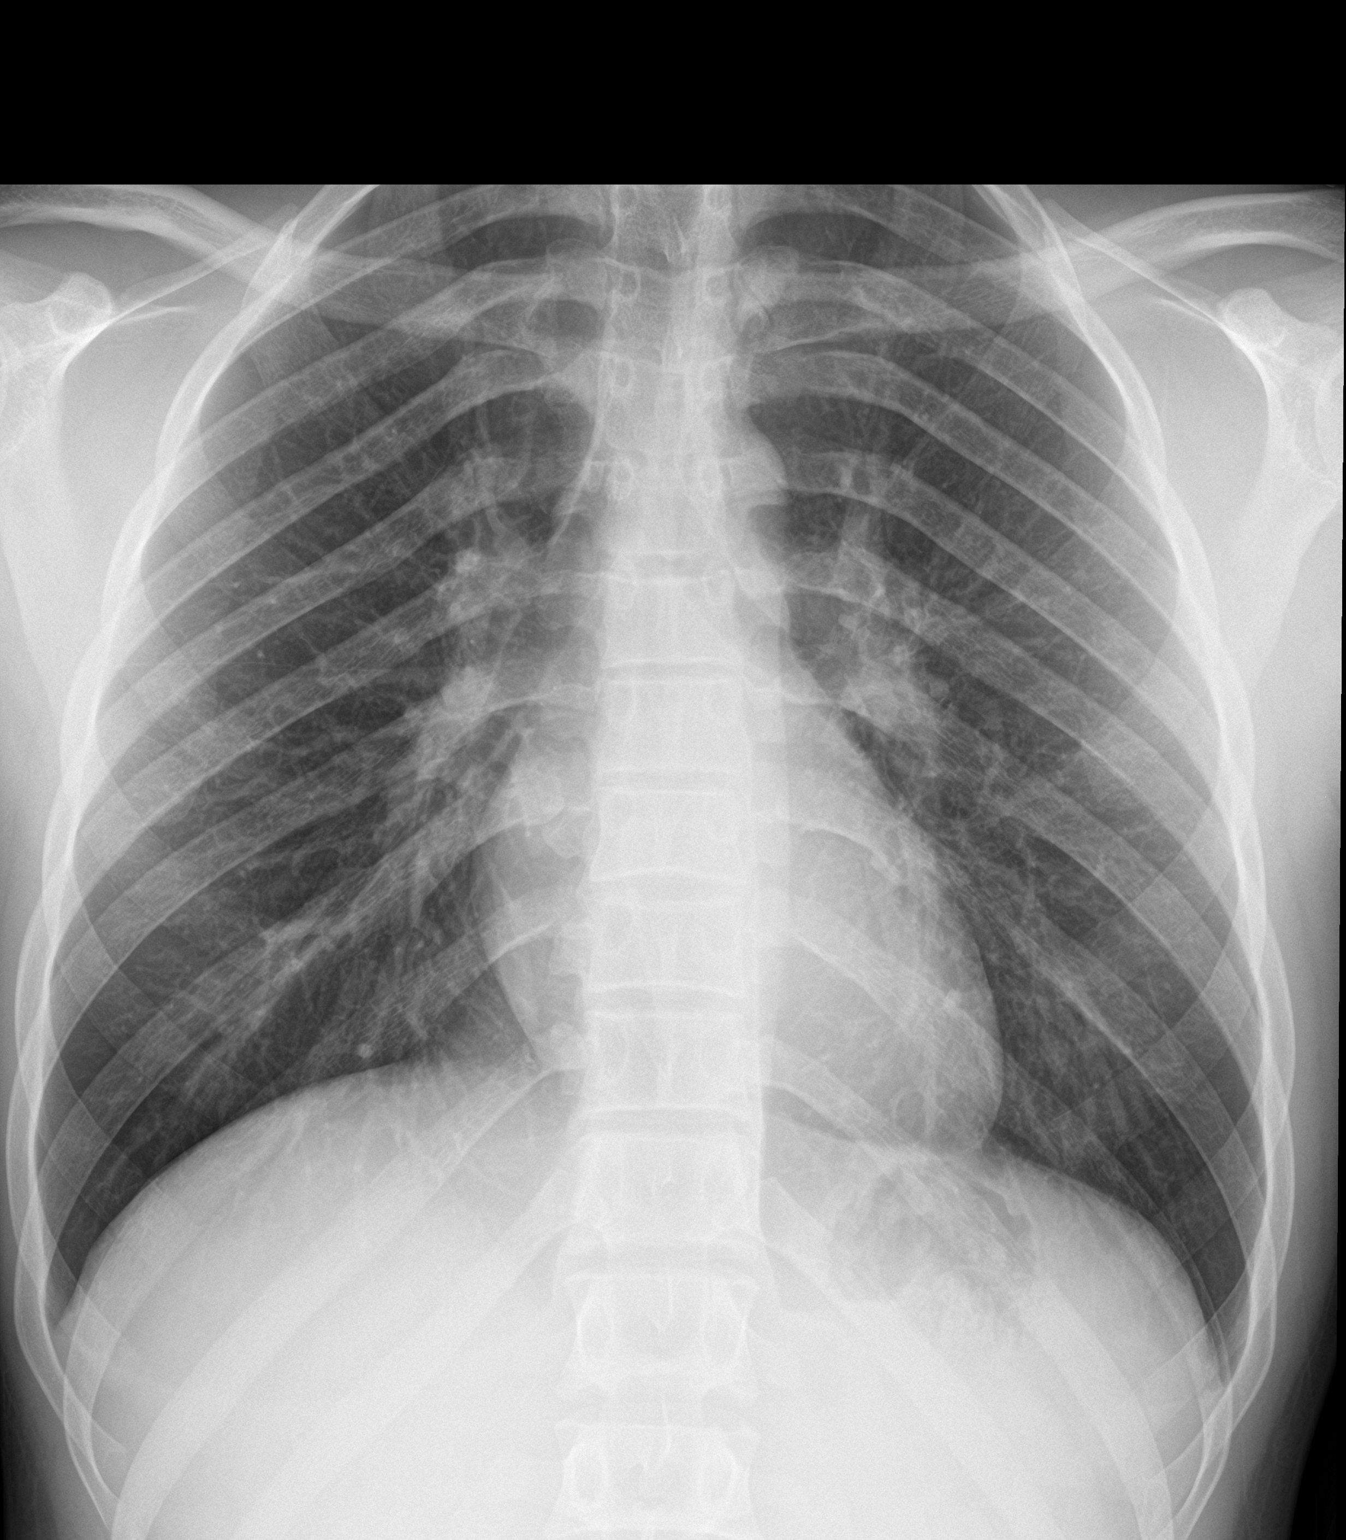

[chest lat]
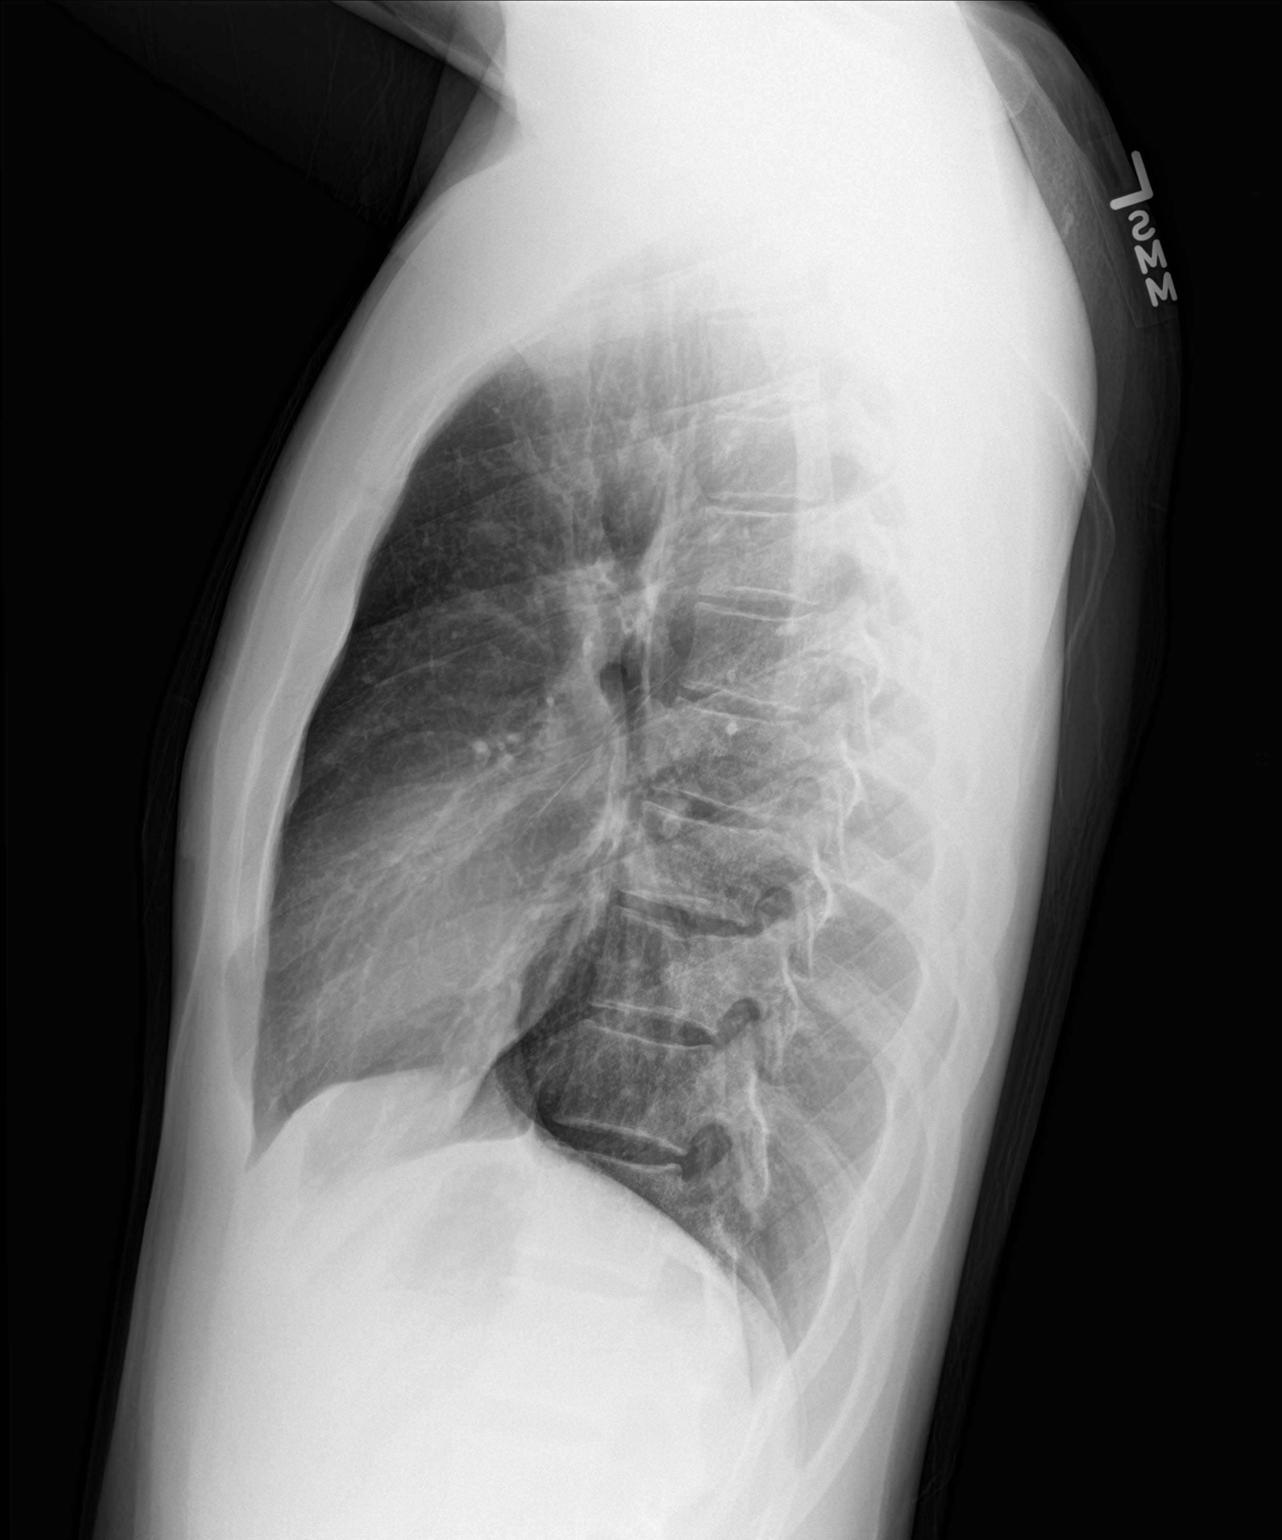

[2 of 2 positions shown; findings below may reference images not displayed]

FINDINGS: The heart size and mediastinal contours are within normal limits.
Both lungs are clear. The visualized skeletal structures are
unremarkable.
IMPRESSION: Negative two view chest x-ray

## 2017-10-04 ENCOUNTER — Encounter (HOSPITAL_COMMUNITY): Payer: Self-pay

## 2017-10-04 ENCOUNTER — Emergency Department (HOSPITAL_COMMUNITY)
Admission: EM | Admit: 2017-10-04 | Discharge: 2017-10-04 | Disposition: A | Discharge status: Home / Self Care | Payer: Medicaid Other | Attending: Emergency Medicine | Admitting: Emergency Medicine

## 2017-10-04 ENCOUNTER — Emergency Department (HOSPITAL_COMMUNITY): Payer: Medicaid Other

## 2017-10-04 DIAGNOSIS — Z79899 Other long term (current) drug therapy: Secondary | ICD-10-CM | POA: Diagnosis not present

## 2017-10-04 DIAGNOSIS — Z7722 Contact with and (suspected) exposure to environmental tobacco smoke (acute) (chronic): Secondary | ICD-10-CM | POA: Diagnosis not present

## 2017-10-04 DIAGNOSIS — M25562 Pain in left knee: Secondary | ICD-10-CM | POA: Diagnosis not present

## 2017-10-04 MED ORDER — IBUPROFEN 800 MG PO TABS: 800.0000 mg | ORAL_TABLET | Freq: Once | ORAL | Status: DC

## 2017-10-04 NOTE — Discharge Instructions (Addendum)
X-rays were reassuring, no broken bones.  Take 800 mg ibuprofen every 6 hours as needed for pain.  Ice for 15 minutes at a time at least twice a day.  Elevate the knee when you can.  Please follow-up with the orthopedic doctor if your symptoms are improving in a week.  Information below to call Dr. Carola FrostHandy.

## 2017-10-04 NOTE — ED Triage Notes (Signed)
Patient complains of left knee pain after missing a step yesterday and twisting same, pain with ambulation

## 2017-10-04 NOTE — ED Notes (Signed)
Patient transported to X-ray 

## 2017-10-04 NOTE — ED Provider Notes (Signed)
[facility] Provider Note   CSN: [id number] Arrival date & time: 10/04/17  1011     History   Chief Complaint Chief Complaint  Patient presents with  . Knee Pain    HPI Jassiah Mikki SanteeJ Drone is a 22 y.o. male.  HPI   Aly Janee Mornhompson is a [place]21yo male with no syncope past medical history who presents to the emergency department for evaluation of left knee pain.  Patient reports that he was walking downstairs yesterday when he accidentally slipped and caught himself with his left leg, hyperflexing the knee.  States that he heard a popping noise in the knee at the time.  He did not hit his head or lose consciousness.  He reports that he has had left knee pain ever since.  Pain is located generally over the patella and over joint line, he denies focal tenderness.  He also noticed some swelling over the joing.  Pain in 8/10 in severity, described as "sore and tender" and worsened with knee flexion/extension and weightbearing.  He reports pain is improved with resting.  He applied some BenGay cream without relief.  Denies fevers, chills, numbness, weakness, break in skin, arthralgias elsewhere.  Is able to ambulate independently despite pain.  History reviewed. No pertinent past medical history.  Patient Active Problem List   Diagnosis Date Noted  . ALLERGIC RHINITIS, SEASONAL 06/29/2008  . OBESITY, NOS 05/15/2006    Past Surgical History:  Procedure Laterality Date  . APPENDECTOMY    . APPENDECTOMY    . WISDOM TOOTH EXTRACTION          Home Medications    Prior to Admission medications   Medication Sig Start Date End Date Taking? Authorizing Provider  albuterol (PROVENTIL HFA;VENTOLIN HFA) 108 (90 Base) MCG/ACT inhaler Inhale 1-2 puffs into the lungs every 6 (six) hours as needed for wheezing or shortness of breath (cough). 02/23/16   Schorr, Roma KayserKatherine P, NP  amoxicillin (AMOXIL) 500 MG capsule Take 1 capsule (500 mg total) by mouth 3 (three)  times daily. Patient not taking: Reported on 06/13/2017 05/20/14   Marcellina MillinGaley, Timothy, MD  aspirin-acetaminophen-caffeine [facility](EXCEDRIN MIGRAINE) [phone] MG tablet Take 1 tablet by mouth every 6 (six) hours as needed for headache.    [provider]  hydrOXYzine (ATARAX/VISTARIL) 25 MG tablet Take 1 tablet (25 mg total) by mouth every 6 (six) hours. For itching Patient not taking: Reported on 06/13/2017 09/01/12   Linna HoffKindl, James D, MD  ibuprofen (ADVIL,MOTRIN) 200 MG tablet Take 200 mg by mouth every 6 (six) hours as needed for headache.    [provider]  ipratropium (ATROVENT) 0.06 % nasal spray Place 2 sprays into both nostrils 4 (four) times daily. Patient not taking: Reported on 06/13/2017 02/23/16   Schorr, Roma KayserKatherine P, NP  loratadine (CLARITIN) 10 MG tablet Take 1 tablet (10 mg total) by mouth daily. 06/13/17   Coralyn MarkMitchell, Melanie L, NP  naproxen (NAPROSYN) 500 MG tablet Take 1 tablet (500 mg total) by mouth 2 (two) times daily. 06/13/17   Coralyn MarkMitchell, Melanie L, NP  ondansetron (ZOFRAN ODT) 4 MG disintegrating tablet Take 1 tablet (4 mg total) by mouth every 8 (eight) hours as needed for nausea or vomiting. Patient not taking: Reported on 06/13/2017 07/06/16   Melene PlanFloyd, Dan, DO  predniSONE (DELTASONE) 20 MG tablet Take 3 tablets (60 mg total) by mouth daily. Patient not taking: Reported on 06/13/2017 04/27/15   Santiago GladLaisure, Heather, PA-C    Family History No family history on  file.  Social History Social History   Tobacco Use  . Smoking status: Passive Smoke Exposure - Never Smoker  . Smokeless tobacco: Never Used  Substance Use Topics  . Alcohol use: No  . Drug use: No     Allergies   Patient has no known allergies.   Review of Systems Review of Systems  Constitutional: Negative for chills and fever.  Musculoskeletal: Positive for arthralgias (left knee) and joint swelling (left knee). Negative for gait problem.  Skin: Negative for color change and wound.  Neurological: Negative  for weakness and numbness.     Physical Exam Updated Vital Signs BP (!) 120/59   Pulse (!) 57   Temp 98 F (36.7 C) (Oral)   Resp 18   SpO2 100%   Physical Exam  Constitutional: He appears well-developed and well-nourished. No distress.  HENT:  Head: Normocephalic and atraumatic.  Eyes: Right eye exhibits no discharge. Left eye exhibits no discharge.  Pulmonary/Chest: Effort normal. No respiratory distress.  Musculoskeletal:  Left  knee with tenderness to palpation generally over the patella and patellar tendon with mild overlying swelling. No joint effusion. Full active flexion/extension although painful.  No abnormal alignment or patellar mobility. No bruising, erythema or warmth overlaying the joint. No varus/valgus laxity. Negative drawer's, Lachman's and McMurray's.  No crepitus. 2+ DP pulses bilaterally. All compartments are soft. Sensation intact distal to injury.  Neurological: He is alert. Coordination normal.  Skin: Skin is warm and dry. He is not diaphoretic.  Psychiatric: He has a normal mood and affect. His behavior is normal.  Nursing note and vitals reviewed.    ED Treatments / Results  Labs (all labs ordered are listed, but only abnormal results are displayed) Labs Reviewed - No data to display  EKG None  Radiology Dg Knee Complete 4 Views Left  Result Date: 10/04/2017 CLINICAL DATA:  LEFT knee pain after missing step EXAM: LEFT KNEE - COMPLETE 4+ VIEW COMPARISON:  None FINDINGS: No fracture of the proximal tibia or distal femur. Patella is normal. No joint effusion. IMPRESSION: No fracture or dislocation. Electronically Signed   By: Genevive Bi M.D.   On: 10/04/2017 11:19    Procedures Procedures (including critical care time)  Medications Ordered in ED Medications  ibuprofen (ADVIL,MOTRIN) tablet 800 mg (has no administration in time range)     Initial Impression / Assessment and Plan / ED Course  I have reviewed the triage vital signs and  the nursing notes.  Pertinent labs & imaging results that were available during my care of the patient were reviewed by me and considered in my medical decision making (see chart for details).    X-ray left knee negative for acute fracture or knee.  On exam, left lower extremity neurovascularly intact.  No erythema, warmth, joint effusion or swelling to suggest infectious process.  No varus/laxity and negative drawers.  Doubt ligamentous injury.  Patient requesting knee immobilizer, this was provided in the ER along with crutches for support.  Discussed RICE protocol and NSAID use for pain.  Have given him the information to follow-up with orthopedics if his symptoms are not improving in a week.  Discussed reasons to return to the emergency department he agrees.  Final Clinical Impressions(s) / ED Diagnoses   Final diagnoses:  Acute pain of left knee    ED Discharge Orders    None       Lawrence Marseilles 10/04/17 1231    Jacalyn Lefevre, MD 10/04/17 1301

## 2017-11-18 ENCOUNTER — Encounter (INDEPENDENT_AMBULATORY_CARE_PROVIDER_SITE_OTHER): Payer: Self-pay | Admitting: Physician Assistant

## 2017-11-18 ENCOUNTER — Ambulatory Visit (INDEPENDENT_AMBULATORY_CARE_PROVIDER_SITE_OTHER): Payer: Medicaid Other | Admitting: Physician Assistant

## 2017-11-18 VITALS — BP 105/61 | HR 78 | Temp 98.1°F | Resp 18 | Ht 72.0 in | Wt 175.0 lb

## 2017-11-18 DIAGNOSIS — R61 Generalized hyperhidrosis: Secondary | ICD-10-CM

## 2017-11-18 DIAGNOSIS — Z0000 Encounter for general adult medical examination without abnormal findings: Secondary | ICD-10-CM

## 2017-11-18 DIAGNOSIS — Z114 Encounter for screening for human immunodeficiency virus [HIV]: Secondary | ICD-10-CM

## 2017-11-18 NOTE — Progress Notes (Signed)
Subjective:  Patient ID: Ryan Hodges, male    DOB: [birth year]  Age: 22 y.o. MRN: [id number]  CC: physical   HPI Ryan ATLAS Hodges is a 22 y.o. male with no significant medical history presents to establish care. Says he feels well. Questions his excessive sweating. Sweats profusely during activities, warm temperatures, and even in the winter if he becomes active. Does not endorse family history or personal hx of DM, thyroid disease, or anxiety. Does not have any other symptoms or complaints.        Outpatient Medications Prior to Visit  Medication Sig Dispense Refill  . ibuprofen (ADVIL,MOTRIN) 200 MG tablet Take 200 mg by mouth every 6 (six) hours as needed for headache.    . albuterol (PROVENTIL HFA;VENTOLIN HFA) 108 (90 Base) MCG/ACT inhaler Inhale 1-2 puffs into the lungs every 6 (six) hours as needed for wheezing or shortness of breath (cough). 1 Inhaler 0  . amoxicillin (AMOXIL) 500 MG capsule Take 1 capsule (500 mg total) by mouth 3 (three) times daily. (Patient not taking: Reported on 06/13/2017) 30 capsule 0  . aspirin-acetaminophen-caffeine (EXCEDRIN MIGRAINE) 250-250-65 MG tablet Take 1 tablet by mouth every 6 (six) hours as needed for headache.    . hydrOXYzine (ATARAX/VISTARIL) 25 MG tablet Take 1 tablet (25 mg total) by mouth every 6 (six) hours. For itching (Patient not taking: Reported on 06/13/2017) 20 tablet 0  . ipratropium (ATROVENT) 0.06 % nasal spray Place 2 sprays into both nostrils 4 (four) times daily. (Patient not taking: Reported on 06/13/2017) 15 mL 12  . loratadine (CLARITIN) 10 MG tablet Take 1 tablet (10 mg total) by mouth daily. 30 tablet 1  . naproxen (NAPROSYN) 500 MG tablet Take 1 tablet (500 mg total) by mouth 2 (two) times daily. 30 tablet 0  . ondansetron (ZOFRAN ODT) 4 MG disintegrating tablet Take 1 tablet (4 mg total) by mouth every 8 (eight) hours as needed for nausea or vomiting. (Patient not taking: Reported on 06/13/2017) 20 tablet 0  .  predniSONE (DELTASONE) 20 MG tablet Take 3 tablets (60 mg total) by mouth daily. (Patient not taking: Reported on 06/13/2017) 15 tablet 0   No facility-administered medications prior to visit.      ROS Review of Systems  Constitutional: Negative for chills, fever and malaise/fatigue.  Eyes: Negative for blurred vision.  Respiratory: Negative for shortness of breath.   Cardiovascular: Negative for chest pain and palpitations.  Gastrointestinal: Negative for abdominal pain and nausea.  Genitourinary: Negative for dysuria and hematuria.  Musculoskeletal: Negative for joint pain and myalgias.  Skin: Negative for rash.  Neurological: Negative for tingling and headaches.  Psychiatric/Behavioral: Negative for depression. The patient is not nervous/anxious.     Objective:  BP 105/61 (BP Location: Left Arm, Patient Position: Sitting, Cuff Size: Normal)   Pulse 78   Temp 98.1 F (36.7 C) (Oral)   Resp 18   Ht 6' (1.829 m)   Wt 175 lb (79.4 kg)   SpO2 99%   BMI 23.73 kg/m   BP/Weight 11/18/2017 10/04/2017 06/13/2017  Systolic BP 105 120 124  Diastolic BP 61 59 74  Wt. (Lbs) 175 - -  BMI 23.73 - -      Physical Exam  Constitutional: He is oriented to person, place, and time.  Well developed, well nourished, NAD, polite  HENT:  Head: Normocephalic and atraumatic.  Eyes: No scleral icterus.  Neck: Normal range of motion. Neck supple. No thyromegaly present.  Cardiovascular: Normal rate, regular  rhythm and normal heart sounds.  Pulmonary/Chest: Effort normal and breath sounds normal.  Abdominal: Soft. Bowel sounds are normal. There is no tenderness.  Musculoskeletal: He exhibits no edema.  Neurological: He is alert and oriented to person, place, and time.  Skin: Skin is warm and dry. No rash noted. No erythema. No pallor.  No hyperhidrosis observed.  Psychiatric: He has a normal mood and affect. His behavior is normal. Thought content normal.  Vitals reviewed.    Assessment &  Plan:    1. Annual physical exam - CBC with Differential - Comprehensive metabolic panel - TSH  2. Hyperhidrosis - CBC with Differential - Comprehensive metabolic panel - TSH  3. Screening for HIV (human immunodeficiency virus) - HIV antibody   Follow-up: PRN   Loletta Specter PA

## 2017-11-18 NOTE — Patient Instructions (Signed)
Hyperhidrosis  It is normal to sweat when you are hot, being physically active, or feeling anxious. Sweating is a necessary function for your body. However, hyperhidrosis is when you sweat too much (excessively). Although hyperhidrosis is not dangerous, it can make you feel embarrassed.  There are two kinds of hyperhidrosis:   Primary hyperhidrosis. The sweating usually localizes in one part of your body, such as your underarms, or in a few areas, such as your feet, face, armpits, and hands. This is the more common kind of hyperhidrosis.   Secondary hyperhidrosis. This type more likely affects your entire body.    What are the causes?  The cause of your hyperhidrosis depends on the kind you have.   Primary hyperhidrosis may be caused by having sweat glands that are more active than normal.   Secondary hyperhidrosis is caused by an underlying condition. Possible conditions include:  ? Diabetes.  ? Gout.  ? Certain medicines.  ? Anxiety.  ? Stroke.  ? Obesity.  ? Menopause.  ? Overactive thyroid (hyperthyroidism).  ? Tumors.  ? Frostbite.  ? Certain types of cancers.  ? Alcoholism.  ? Injury to your nervous system.  ? Stroke.  ? Parkinson disease.    What increases the risk?  You may be at an increased risk for primary hyperhidrosis if you have a family history of it.  What are the signs or symptoms?  General symptoms of hyperhidrosis may include:   Feeling like you are sweating constantly, even while you are resting.   Having skin that peels or gets paler or softer in the areas where you sweat the most.   Being able to see sweat on your skin.    Symptoms of primary hyperhidrosis may include:   Sweating in specific areas, such as your armpits, palms, feet, and face.   Sweating in the same location on both sides of your body.   Sweating only during the day.    Symptoms of secondary hyperhidrosis may include:   Sweating all over your body.   Sweating even while you sleep.    How is this  diagnosed?  Hyperhidrosis may be diagnosed by:   Medical history and physical exam.   Testing, such as:  ? Sweat test.  ? Paper test.    How is this treated?  Your treatment will depend on the kind of hyperhidrosis you have and the parts of your body that are affected. If your hyperhidrosis is caused by an underlying condition, your treatment will address the cause. Treatment may include:   Strong antiperspirants. Your health care provider may give you a prescription.   Medicines taken by mouth.   Medicines injected by your health care provider. These may include small amounts of botulinum toxin.   Iontophoresis. This is a procedure that temporarily turns off the sweat glands in your hands and feet.   Surgery to remove your sweat glands.   Sympathectomy. This is a procedure that cuts or destroys your nerves so that they do not send a signal to sweat.    Follow these instructions at home:   Take medicines only as directed by your health care provider.   Use antiperspirants as directed by your health care provider.   Limit or avoid foods or beverages that seem to increase your chances of sweating, such as:  ? Spicy food.  ? Caffeine.  ? Alcohol.  ? Foods that contain MSG.   If your feet sweat:  ? Wear sandals, when possible.  ?   Do not wear cotton socks. Wear socks that remove or wick moisture from your feet.  ? Wear leather shoes.  ? Avoid wearing the same pair of shoes two days in a row.   Consider joining a hyperhidrosis support group.  Contact a health care provider if:   You have new symptoms.   Your symptoms get worse.  This information is not intended to replace advice given to you by your health care provider. Make sure you discuss any questions you have with your health care provider.  Document Released: 05/03/2005 Document Revised: 08/10/2015 Document Reviewed: 10/12/2013  Elsevier Interactive Patient Education  2018 [employer]

## 2017-11-19 ENCOUNTER — Telehealth (INDEPENDENT_AMBULATORY_CARE_PROVIDER_SITE_OTHER): Payer: Self-pay

## 2017-11-19 ENCOUNTER — Telehealth (INDEPENDENT_AMBULATORY_CARE_PROVIDER_SITE_OTHER): Payer: Self-pay | Admitting: Physician Assistant

## 2017-11-19 LAB — CBC WITH DIFFERENTIAL/PLATELET
BASOS: 1 %
Basophils Absolute: 0 10*3/uL (ref 0.0–0.2)
EOS (ABSOLUTE): 0.3 10*3/uL (ref 0.0–0.4)
EOS: 9 %
HEMATOCRIT: 47.5 % (ref 37.5–51.0)
Hemoglobin: 16.5 g/dL (ref 13.0–17.7)
IMMATURE GRANULOCYTES: 0 %
Immature Grans (Abs): 0 10*3/uL (ref 0.0–0.1)
LYMPHS ABS: 1.5 10*3/uL (ref 0.7–3.1)
Lymphs: 42 %
MCH: 31.1 pg (ref 26.6–33.0)
MCHC: 34.7 g/dL (ref 31.5–35.7)
MCV: 90 fL (ref 79–97)
MONOS ABS: 0.4 10*3/uL (ref 0.1–0.9)
Monocytes: 13 %
NEUTROS ABS: 1.2 10*3/uL — AB (ref 1.4–7.0)
Neutrophils: 35 %
Platelets: 200 10*3/uL (ref 150–450)
RBC: 5.3 x10E6/uL (ref 4.14–5.80)
RDW: 12.5 % (ref 12.3–15.4)
WBC: 3.4 10*3/uL (ref 3.4–10.8)

## 2017-11-19 LAB — COMPREHENSIVE METABOLIC PANEL
A/G RATIO: 2.1 (ref 1.2–2.2)
ALT: 26 IU/L (ref 0–44)
AST: 21 IU/L (ref 0–40)
Albumin: 4.7 g/dL (ref 3.5–5.5)
Alkaline Phosphatase: 71 IU/L (ref 39–117)
BILIRUBIN TOTAL: 0.9 mg/dL (ref 0.0–1.2)
BUN/Creatinine Ratio: 7 — ABNORMAL LOW (ref 9–20)
BUN: 7 mg/dL (ref 6–20)
CALCIUM: 9.5 mg/dL (ref 8.7–10.2)
CHLORIDE: 103 mmol/L (ref 96–106)
CO2: 24 mmol/L (ref 20–29)
Creatinine, Ser: 1 mg/dL (ref 0.76–1.27)
GFR, EST AFRICAN AMERICAN: 125 mL/min/{1.73_m2} (ref >59)
GFR, EST NON AFRICAN AMERICAN: 108 mL/min/{1.73_m2} (ref >59)
GLOBULIN, TOTAL: 2.2 g/dL (ref 1.5–4.5)
Glucose: 86 mg/dL (ref 65–99)
POTASSIUM: 4.3 mmol/L (ref 3.5–5.2)
Sodium: 140 mmol/L (ref 134–144)
Total Protein: 6.9 g/dL (ref 6.0–8.5)

## 2017-11-19 LAB — TSH: TSH: 0.669 u[IU]/mL (ref 0.450–4.500)

## 2017-11-19 LAB — HIV ANTIBODY (ROUTINE TESTING W REFLEX): HIV SCREEN 4TH GENERATION: NONREACTIVE

## 2017-11-19 NOTE — Telephone Encounter (Signed)
Left message with patients mother to have patient return call to [facility] at [phone]. Maryjean Morn, CMA

## 2017-11-19 NOTE — Telephone Encounter (Signed)
Noted. Wren Pryce S Haille Pardi, CMA  

## 2017-11-19 NOTE — Telephone Encounter (Signed)
-----   Message from Loletta Specter, PA-C sent at 11/19/2017 12:37 PM EDT ----- Normal results.

## 2017-11-19 NOTE — Telephone Encounter (Signed)
Patient called returning CMA call. Front desk gave patient lab results. Patient is aware that his labs are normal.   Louisa Second

## 2018-10-12 ENCOUNTER — Encounter (HOSPITAL_COMMUNITY): Payer: Self-pay

## 2018-10-12 ENCOUNTER — Emergency Department (HOSPITAL_COMMUNITY)
Admission: EM | Admit: 2018-10-12 | Discharge: 2018-10-13 | Disposition: A | Discharge status: Home / Self Care | Payer: Medicaid Other | Attending: Emergency Medicine | Admitting: Emergency Medicine

## 2018-10-12 ENCOUNTER — Other Ambulatory Visit: Payer: Self-pay

## 2018-10-12 DIAGNOSIS — R109 Unspecified abdominal pain: Secondary | ICD-10-CM

## 2018-10-12 DIAGNOSIS — K5900 Constipation, unspecified: Secondary | ICD-10-CM | POA: Insufficient documentation

## 2018-10-12 DIAGNOSIS — R1030 Lower abdominal pain, unspecified: Secondary | ICD-10-CM | POA: Insufficient documentation

## 2018-10-12 DIAGNOSIS — Z202 Contact with and (suspected) exposure to infections with a predominantly sexual mode of transmission: Secondary | ICD-10-CM | POA: Insufficient documentation

## 2018-10-12 DIAGNOSIS — Z7722 Contact with and (suspected) exposure to environmental tobacco smoke (acute) (chronic): Secondary | ICD-10-CM | POA: Insufficient documentation

## 2018-10-12 DIAGNOSIS — R300 Dysuria: Secondary | ICD-10-CM | POA: Insufficient documentation

## 2018-10-12 LAB — COMPREHENSIVE METABOLIC PANEL
ALT: 37 U/L (ref 0–44)
AST: 26 U/L (ref 15–41)
Albumin: 4.3 g/dL (ref 3.5–5.0)
Alkaline Phosphatase: 64 U/L (ref 38–126)
Anion gap: 10 (ref 5–15)
BUN: 8 mg/dL (ref 6–20)
CO2: 25 mmol/L (ref 22–32)
Calcium: 9.4 mg/dL (ref 8.9–10.3)
Chloride: 103 mmol/L (ref 98–111)
Creatinine, Ser: 1.32 mg/dL — ABNORMAL HIGH (ref 0.61–1.24)
GFR calc Af Amer: >60 mL/min (ref >60)
GFR calc non Af Amer: >60 mL/min (ref >60)
Glucose, Bld: 83 mg/dL (ref 70–99)
Potassium: 3.7 mmol/L (ref 3.5–5.1)
Sodium: 138 mmol/L (ref 135–145)
Total Bilirubin: 1.4 mg/dL — ABNORMAL HIGH (ref 0.3–1.2)
Total Protein: 7.1 g/dL (ref 6.5–8.1)

## 2018-10-12 LAB — URINALYSIS, ROUTINE W REFLEX MICROSCOPIC
Bilirubin Urine: NEGATIVE
Glucose, UA: NEGATIVE mg/dL
Hgb urine dipstick: NEGATIVE
Ketones, ur: NEGATIVE mg/dL
Leukocytes,Ua: NEGATIVE
Nitrite: NEGATIVE
Protein, ur: NEGATIVE mg/dL
Specific Gravity, Urine: 1.027 (ref 1.005–1.030)
pH: 6 (ref 5.0–8.0)

## 2018-10-12 LAB — CBC
HCT: 49.5 % (ref 39.0–52.0)
Hemoglobin: 16.6 g/dL (ref 13.0–17.0)
MCH: 31.4 pg (ref 26.0–34.0)
MCHC: 33.5 g/dL (ref 30.0–36.0)
MCV: 93.8 fL (ref 80.0–100.0)
Platelets: 230 10*3/uL (ref 150–400)
RBC: 5.28 MIL/uL (ref 4.22–5.81)
RDW: 11.8 % (ref 11.5–15.5)
WBC: 5.4 10*3/uL (ref 4.0–10.5)
nRBC: 0 % (ref 0.0–0.2)

## 2018-10-12 LAB — LIPASE, BLOOD: Lipase: 30 U/L (ref 11–51)

## 2018-10-12 MED ORDER — SODIUM CHLORIDE 0.9% FLUSH: 3.0000 mL | Freq: Once | INTRAVENOUS | Status: DC

## 2018-10-12 NOTE — ED Triage Notes (Signed)
Patient states lower abdominal pain and some cramping in his back. He states the pain radiates down into his testicles at time. He states he felt a tingle in the head of his penis last night.

## 2018-10-13 MED ORDER — LIDOCAINE HCL (PF) 1 % IJ SOLN
INTRAMUSCULAR | Status: AC
  Administered 2018-10-13: 1 mL
  Filled 2018-10-13: qty 5

## 2018-10-13 MED ORDER — CEFTRIAXONE SODIUM 250 MG IJ SOLR
250.0000 mg | Freq: Once | INTRAMUSCULAR | Status: AC
  Administered 2018-10-13: 250 mg via INTRAMUSCULAR
  Filled 2018-10-13: qty 250

## 2018-10-13 MED ORDER — AZITHROMYCIN 250 MG PO TABS
1000.0000 mg | ORAL_TABLET | Freq: Once | ORAL | Status: AC
  Administered 2018-10-13: 08:00:00 1000 mg via ORAL
  Filled 2018-10-13: qty 4

## 2018-10-13 NOTE — ED Provider Notes (Signed)
[facility] Provider Note   CSN: [id number] Arrival date & time: 10/12/18  2220    History   Chief Complaint Chief Complaint  Patient presents with  . Abdominal Pain    HPI Ryan Hodges is a 23 y.o. male.     Patient with lower abdominal pain for the last several days.  Pain with urination.  Concern for STD.  No known exposures.  Has felt some intermittent lower abdominal cramping, some constipation.  No tenderness currently.  The history is provided by the patient.  Male GU Problem Presenting symptoms: dysuria   Presenting symptoms comment:  Pain with urination, lower abdominal cramping Context: spontaneously   Relieved by:  Nothing Worsened by:  Nothing Associated symptoms: abdominal pain   Associated symptoms: no fever, no flank pain, no genital itching, no genital lesions, no genital rash, no groin pain, no hematuria, no penile swelling, no scrotal swelling, no urinary frequency, no urinary hesitation, no urinary incontinence, no urinary retention and no vomiting   Risk factors: unprotected sex     History reviewed. No pertinent past medical history.  Patient Active Problem List   Diagnosis Date Noted  . ALLERGIC RHINITIS, SEASONAL 06/29/2008  . OBESITY, NOS 05/15/2006    Past Surgical History:  Procedure Laterality Date  . APPENDECTOMY    . APPENDECTOMY    . WISDOM TOOTH EXTRACTION          Home Medications    Prior to Admission medications   Medication Sig Start Date End Date Taking? Authorizing Provider  ibuprofen (ADVIL,MOTRIN) 200 MG tablet Take 200 mg by mouth every 6 (six) hours as needed for headache.    [provider]    Family History History reviewed. No pertinent family history.  Social History Social History   Tobacco Use  . Smoking status: Passive Smoke Exposure - Never Smoker  . Smokeless tobacco: Never Used  Substance Use Topics  . Alcohol use: Yes    Comment: friday nights   . Drug use: Yes    Types: Marijuana     Allergies   Patient has no known allergies.   Review of Systems Review of Systems  Constitutional: Negative for chills and fever.  HENT: Negative for ear pain and sore throat.   Eyes: Negative for pain and visual disturbance.  Respiratory: Negative for cough and shortness of breath.   Cardiovascular: Negative for chest pain and palpitations.  Gastrointestinal: Positive for abdominal pain. Negative for vomiting.  Genitourinary: Positive for dysuria. Negative for bladder incontinence, difficulty urinating, enuresis, flank pain, frequency, hematuria, hesitancy, penile swelling and scrotal swelling.  Musculoskeletal: Negative for arthralgias and back pain.  Skin: Negative for color change and rash.  Neurological: Negative for seizures and syncope.  All other systems reviewed and are negative.    Physical Exam Updated Vital Signs BP 134/79   Pulse 64   Temp 98.2 F (36.8 C) (Oral)   Resp 18   Ht 6\' 1"  (1.854 m)   Wt 82.1 kg   SpO2 100%   BMI 23.88 kg/m   Physical Exam Vitals signs and nursing note reviewed.  Constitutional:      Appearance: He is well-developed.  HENT:     Head: Normocephalic and atraumatic.  Eyes:     Extraocular Movements: Extraocular movements intact.     Conjunctiva/sclera: Conjunctivae normal.     Pupils: Pupils are equal, round, and reactive to light.  Neck:     Musculoskeletal: Neck supple.  Cardiovascular:  Rate and Rhythm: Normal rate and regular rhythm.     Heart sounds: Normal heart sounds. No murmur.  Pulmonary:     Effort: Pulmonary effort is normal. No respiratory distress.     Breath sounds: Normal breath sounds.  Abdominal:     General: Abdomen is flat.     Palpations: Abdomen is soft.     Tenderness: There is no abdominal tenderness. There is no right CVA tenderness, left CVA tenderness, guarding or rebound. Negative signs include Murphy's sign, Rovsing's sign, McBurney's sign and psoas  sign.  Genitourinary:    Penis: Normal.      Scrotum/Testes: Normal.        Right: Mass, tenderness or swelling not present.        Left: Mass, tenderness or swelling not present.  Skin:    General: Skin is warm and dry.     Capillary Refill: Capillary refill takes less than 2 seconds.  Neurological:     General: No focal deficit present.     Mental Status: He is alert.  Psychiatric:        Mood and Affect: Mood normal.      ED Treatments / Results  Labs (all labs ordered are listed, but only abnormal results are displayed) Labs Reviewed  COMPREHENSIVE METABOLIC PANEL - Abnormal; Notable for the following components:      Result Value   Creatinine, Ser 1.32 (*)    Total Bilirubin 1.4 (*)    All other components within normal limits  LIPASE, BLOOD  CBC  URINALYSIS, ROUTINE W REFLEX MICROSCOPIC  GC/CHLAMYDIA PROBE AMP ([employer]) NOT AT [facility]    EKG None  Radiology No results found.  Procedures Procedures (including critical care time)  Medications Ordered in ED Medications  sodium chloride flush (NS) 0.9 % injection 3 mL (has no administration in time range)  cefTRIAXone (ROCEPHIN) injection 250 mg (has no administration in time range)  azithromycin (ZITHROMAX) tablet 1,000 mg (has no administration in time range)  lidocaine (PF) (XYLOCAINE) 1 % injection (has no administration in time range)     Initial Impression / Assessment and Plan / ED Course  I have reviewed the triage vital signs and the nursing notes.  Pertinent labs & imaging results that were available during my care of the patient were reviewed by me and considered in my medical decision making (see chart for details).        Ryan Hodges is a 23 year old male with no significant medical history who presents to the ED with pain with urination, lower abdominal pain.  Patient with normal vitals.  No fever.  Pain has been ongoing for the last several days.  Patient mostly concerned about an  STD.  Denies any penile discharge.  GU exam is unremarkable.  Lab work showed no significant anemia, electrolyte abnormality.  Kidney function mildly elevated at 1.3.  Likely from mild dehydration.  Recommend increase hydration, follow-up with primary care doctor to recheck kidney function.  Patient states that he did not eat or drink much yesterday.  No abdominal tenderness on exam.  We will empirically treat for STDs with Rocephin and azithromycin.  Educated about safe sex.  Will check gonorrhea and chlamydia and urine.  Urinalysis showed no signs of infection.  No signs to suggest pancreatitis or gallbladder liver process.  Possibly some constipation.  Overall no concern for obstruction of the bowel or appendicitis.  Given return precautions and discharged from ED in good condition.  Given return precautions.  This chart was dictated using voice recognition software.  Despite best efforts to proofread,  errors can occur which can change the documentation meaning.    Final Clinical Impressions(s) / ED Diagnoses   Final diagnoses:  Abdominal pain, unspecified abdominal location    ED Discharge Orders    None       Virgina NorfolkCuratolo, Dayanis Bergquist, DO 10/13/18 [phone]

## 2018-10-13 NOTE — Discharge Instructions (Signed)
Follow-up with primary care doctor to recheck kidney function as discussed.  Please increase hydration.

## 2018-10-13 NOTE — ED Notes (Signed)
Pt c/o pain or burning with urination with since Sunday night . Denies penile drainage.

## 2018-10-13 NOTE — ED Notes (Signed)
Pt verbalized understanding of d/c instructions and has no further questions. VSS, NAD. GC requisition added on to urine.

## 2018-10-14 LAB — GC/CHLAMYDIA PROBE AMP (~~LOC~~) NOT AT ARMC
Chlamydia: NEGATIVE
Neisseria Gonorrhea: NEGATIVE

## 2019-03-09 IMAGING — DX DG KNEE COMPLETE 4+V*L*
4 series · 4 of 4 positions shown · non-contrast
Comparison: None

CLINICAL DATA: LEFT knee pain after missing step

EXAM:
LEFT KNEE - COMPLETE 4+ VIEW

[knee ap]
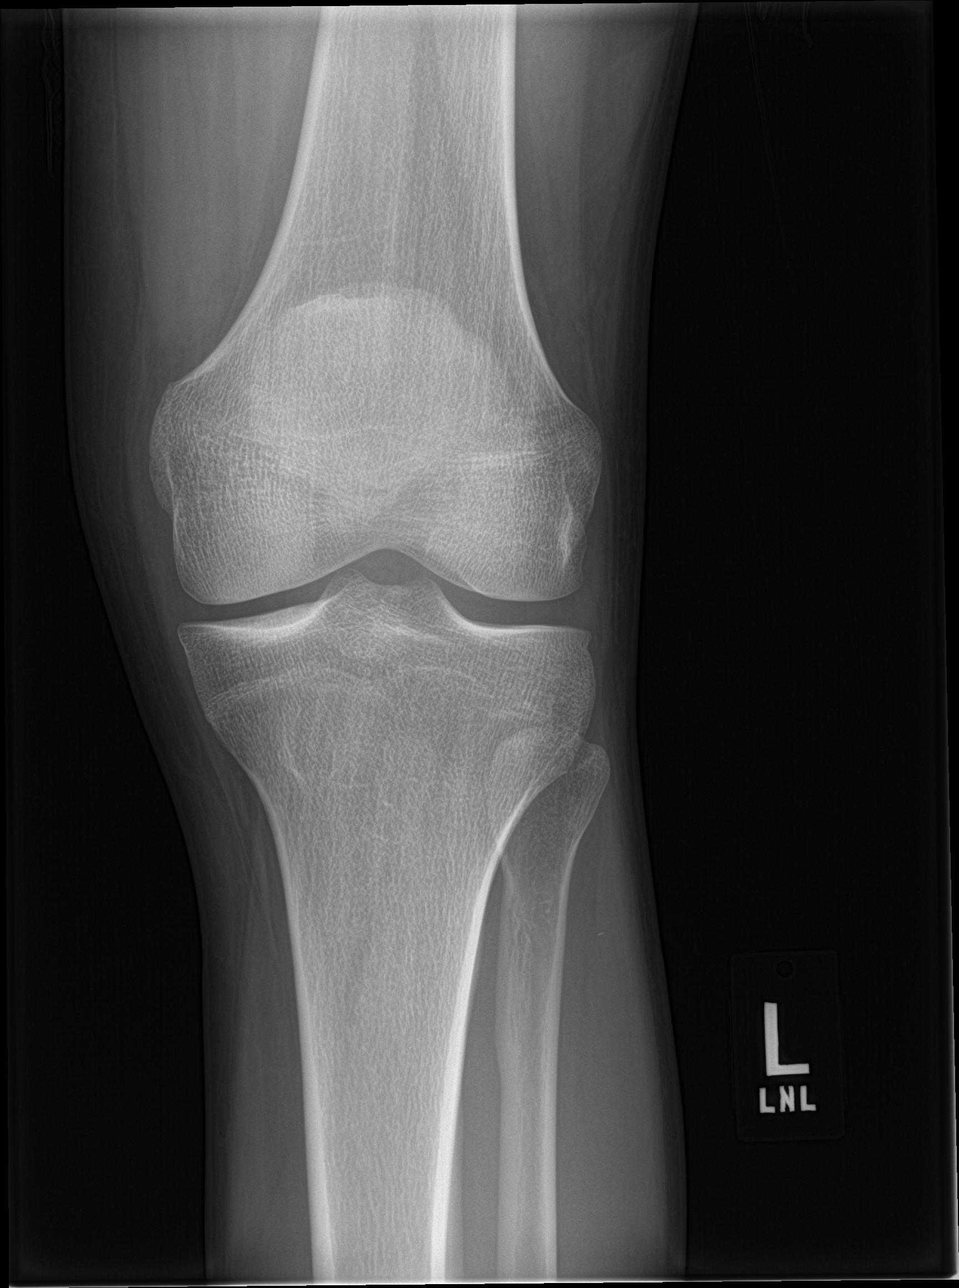

[knee obl (1 of 2)]
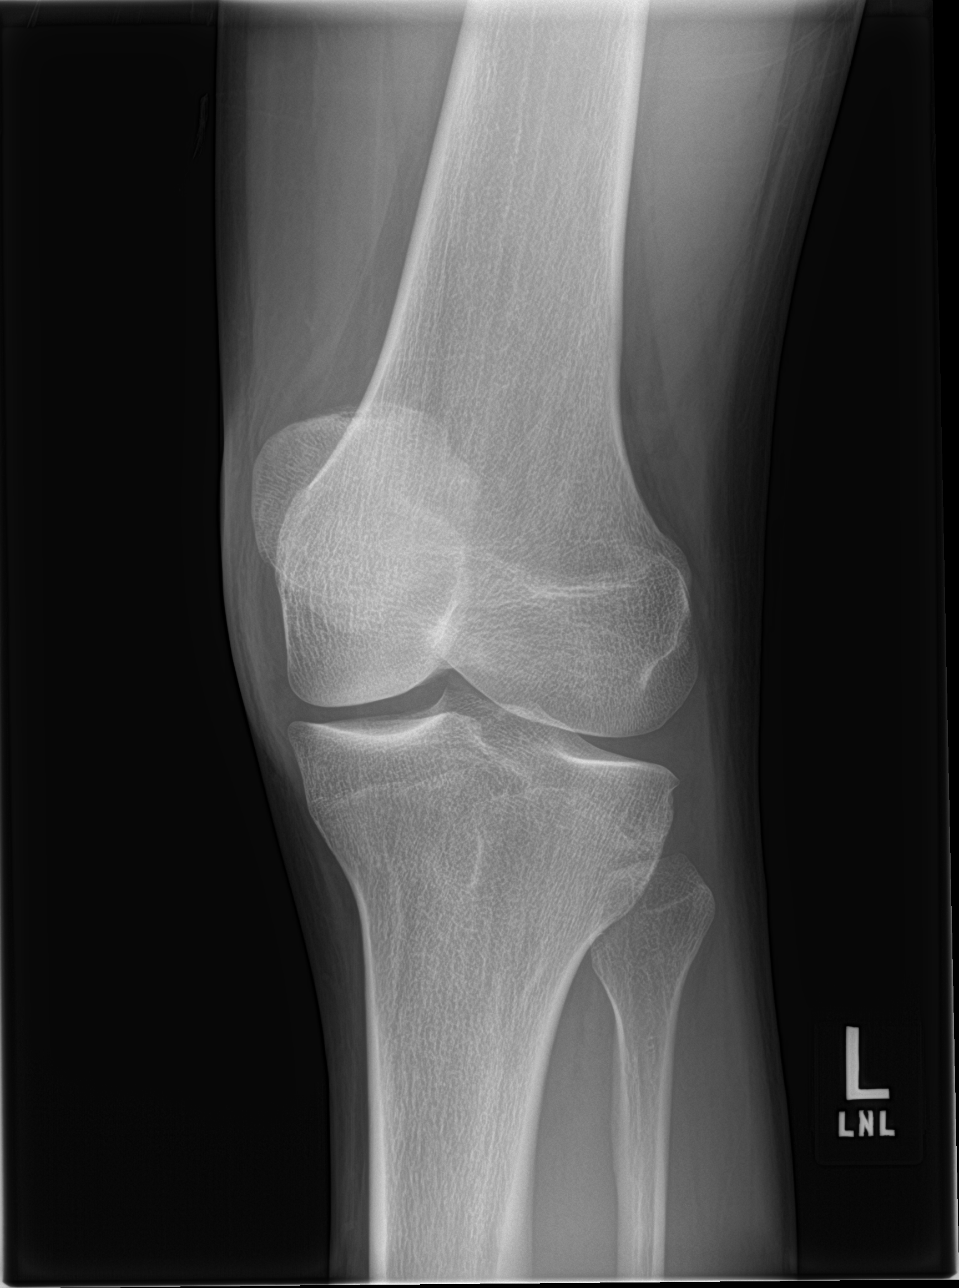

[knee obl (2 of 2)]
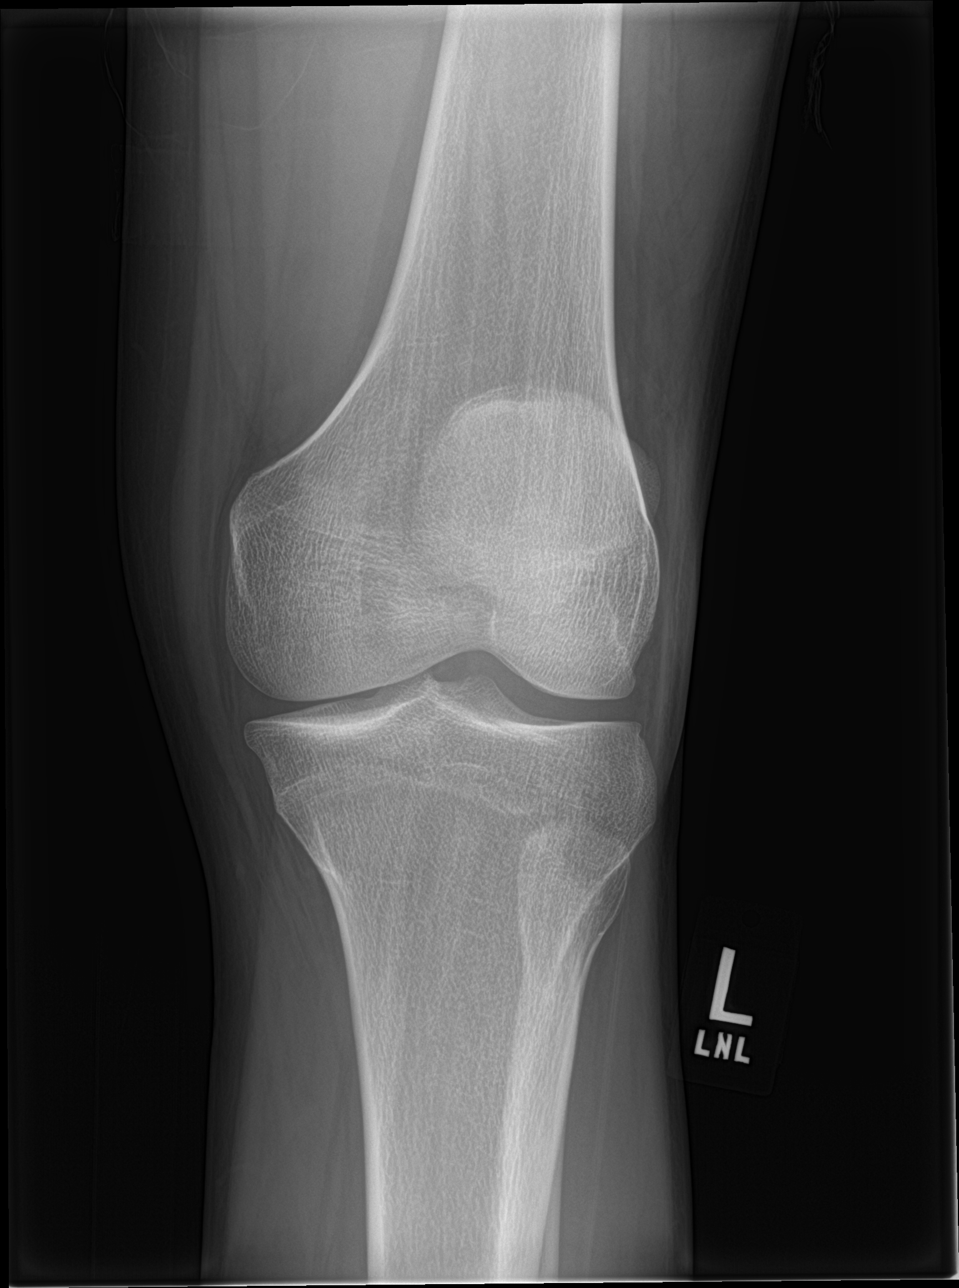

[knee lat]
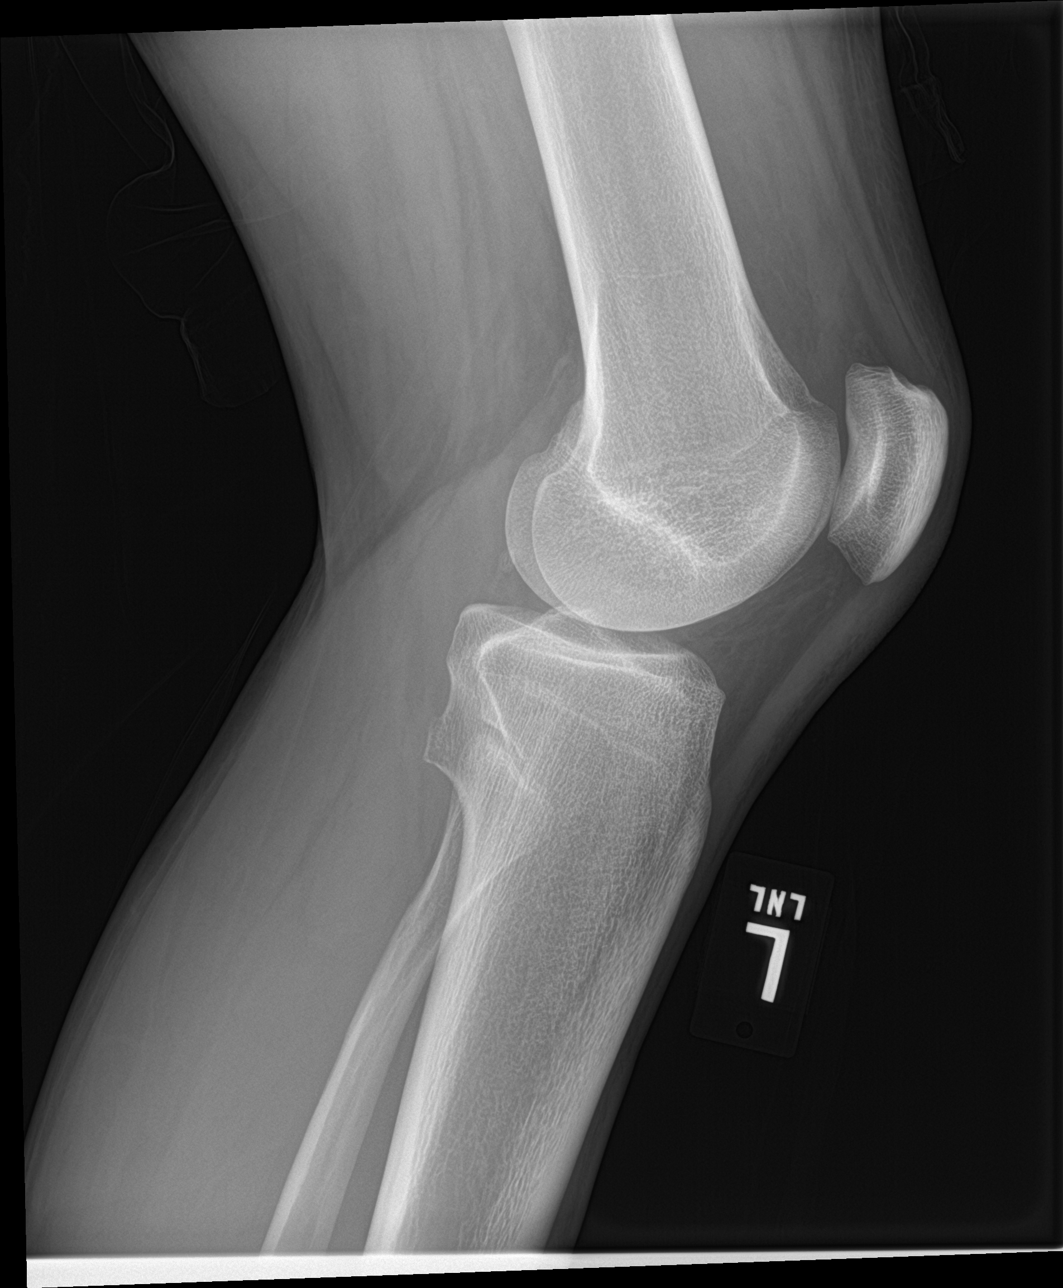

[4 of 4 positions shown; findings below may reference images not displayed]

FINDINGS: No fracture of the proximal tibia or distal femur. Patella is
normal. No joint effusion.
IMPRESSION: No fracture or dislocation.

## 2020-02-09 ENCOUNTER — Other Ambulatory Visit: Payer: Self-pay

## 2020-02-09 ENCOUNTER — Emergency Department (HOSPITAL_COMMUNITY): Payer: Self-pay

## 2020-02-09 ENCOUNTER — Emergency Department (HOSPITAL_COMMUNITY)
Admission: EM | Admit: 2020-02-09 | Discharge: 2020-02-09 | Disposition: A | Discharge status: Home / Self Care | Payer: Self-pay | Attending: Emergency Medicine | Admitting: Emergency Medicine

## 2020-02-09 DIAGNOSIS — S93491A Sprain of other ligament of right ankle, initial encounter: Secondary | ICD-10-CM

## 2020-02-09 DIAGNOSIS — Z7722 Contact with and (suspected) exposure to environmental tobacco smoke (acute) (chronic): Secondary | ICD-10-CM | POA: Insufficient documentation

## 2020-02-09 DIAGNOSIS — R930 Abnormal findings on diagnostic imaging of skull and head, not elsewhere classified: Secondary | ICD-10-CM | POA: Insufficient documentation

## 2020-02-09 DIAGNOSIS — S90811A Abrasion, right foot, initial encounter: Secondary | ICD-10-CM | POA: Insufficient documentation

## 2020-02-09 DIAGNOSIS — S93401A Sprain of unspecified ligament of right ankle, initial encounter: Secondary | ICD-10-CM | POA: Insufficient documentation

## 2020-02-09 DIAGNOSIS — Y92410 Unspecified street and highway as the place of occurrence of the external cause: Secondary | ICD-10-CM | POA: Insufficient documentation

## 2020-02-09 DIAGNOSIS — Y9355 Activity, bike riding: Secondary | ICD-10-CM | POA: Insufficient documentation

## 2020-02-09 MED ORDER — BACITRACIN ZINC 500 UNIT/GM EX OINT
1.0000 "application " | TOPICAL_OINTMENT | Freq: Two times a day (BID) | CUTANEOUS | Status: DC
  Administered 2020-02-09: 1 via TOPICAL
  Filled 2020-02-09: qty 0.9

## 2020-02-09 MED ORDER — IBUPROFEN 800 MG PO TABS
800.0000 mg | ORAL_TABLET | Freq: Once | ORAL | Status: AC
  Administered 2020-02-09: 800 mg via ORAL
  Filled 2020-02-09: qty 1

## 2020-02-09 MED ORDER — NAPROXEN 500 MG PO TABS: 500.0000 mg | ORAL_TABLET | Freq: Two times a day (BID) | ORAL | 0 refills | Status: AC

## 2020-02-09 NOTE — Discharge Instructions (Signed)
Your x-ray showed no signs of broken bones, no signs of injury to your head or your spine, you may take naproxen twice a day as needed for pain, understand that you will likely be more sore tomorrow than you are today but your x-rays were normal.  Keep your wound clean and dry, apply a topical antibiotic after you shower, return for severe or worsening symptoms
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
## 2020-02-09 NOTE — ED Notes (Signed)
Pt discharged via wheelchair with family. All questions and concerns addressed. No complaints at this time.   

## 2020-02-16 ENCOUNTER — Other Ambulatory Visit: Payer: Self-pay

## 2020-02-16 ENCOUNTER — Emergency Department (HOSPITAL_COMMUNITY)
Admission: EM | Admit: 2020-02-16 | Discharge: 2020-02-16 | Discharge status: Against Medical Advice | Payer: No Typology Code available for payment source

## 2020-02-16 NOTE — ED Triage Notes (Signed)
Called times 3 , no answer

## 2021-07-05 ENCOUNTER — Encounter (HOSPITAL_COMMUNITY): Payer: Self-pay | Admitting: Emergency Medicine

## 2021-07-05 ENCOUNTER — Emergency Department (HOSPITAL_COMMUNITY)
Admission: EM | Admit: 2021-07-05 | Discharge: 2021-07-06 | Disposition: A | Discharge status: Home / Self Care | Payer: Self-pay | Attending: Emergency Medicine | Admitting: Emergency Medicine

## 2021-07-05 ENCOUNTER — Other Ambulatory Visit: Payer: Self-pay

## 2021-07-05 ENCOUNTER — Emergency Department (HOSPITAL_COMMUNITY): Payer: Self-pay

## 2021-07-05 DIAGNOSIS — S93401A Sprain of unspecified ligament of right ankle, initial encounter: Secondary | ICD-10-CM | POA: Insufficient documentation

## 2021-07-05 DIAGNOSIS — S8001XA Contusion of right knee, initial encounter: Secondary | ICD-10-CM | POA: Insufficient documentation

## 2021-07-05 DIAGNOSIS — Y92410 Unspecified street and highway as the place of occurrence of the external cause: Secondary | ICD-10-CM | POA: Insufficient documentation

## 2021-07-05 MED ORDER — HYDROCODONE-ACETAMINOPHEN 5-325 MG PO TABS
2.0000 | ORAL_TABLET | Freq: Once | ORAL | Status: AC
  Administered 2021-07-05: 2 via ORAL
  Filled 2021-07-05: qty 2

## 2021-07-05 MED ORDER — TETANUS-DIPHTH-ACELL PERTUSSIS 5-2.5-18.5 LF-MCG/0.5 IM SUSY
0.5000 mL | PREFILLED_SYRINGE | Freq: Once | INTRAMUSCULAR | Status: DC
  Filled 2021-07-05: qty 0.5
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
## 2021-07-06 ENCOUNTER — Emergency Department (HOSPITAL_COMMUNITY): Payer: Self-pay

## 2021-07-06 MED ORDER — IBUPROFEN 800 MG PO TABS: 800.0000 mg | ORAL_TABLET | Freq: Four times a day (QID) | ORAL | 0 refills | Status: AC | PRN
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
## 2021-07-06 NOTE — Progress Notes (Signed)
Orthopedic Tech Progress Note ?Patient Details:  ?Ryan Hodges ?11/22/1995 ?[id number] ? ?Ortho Devices ?Type of Ortho Device: Knee Immobilizer, Crutches ?Ortho Device/Splint Location: rle ?Ortho Device/Splint Interventions: Ordered, Application, Adjustment ?  ?Post Interventions ?Patient Tolerated: Well ? ?Al Decant ?07/06/2021, 2:58 AM ? ?

## 2021-07-06 NOTE — ED Notes (Signed)
Pt's ankle wrapped in a ace crap and wounds bandaged prior to knee immobilizer. Ortho tech at bedside  ?
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
## 2021-07-14 IMAGING — CR DG FOOT COMPLETE 3+V*R*
3 series · 3 of 3 positions shown · non-contrast
Comparison: None.

CLINICAL DATA: Pain

EXAM:
RIGHT FOOT COMPLETE - 3+ VIEW

[foot ap]
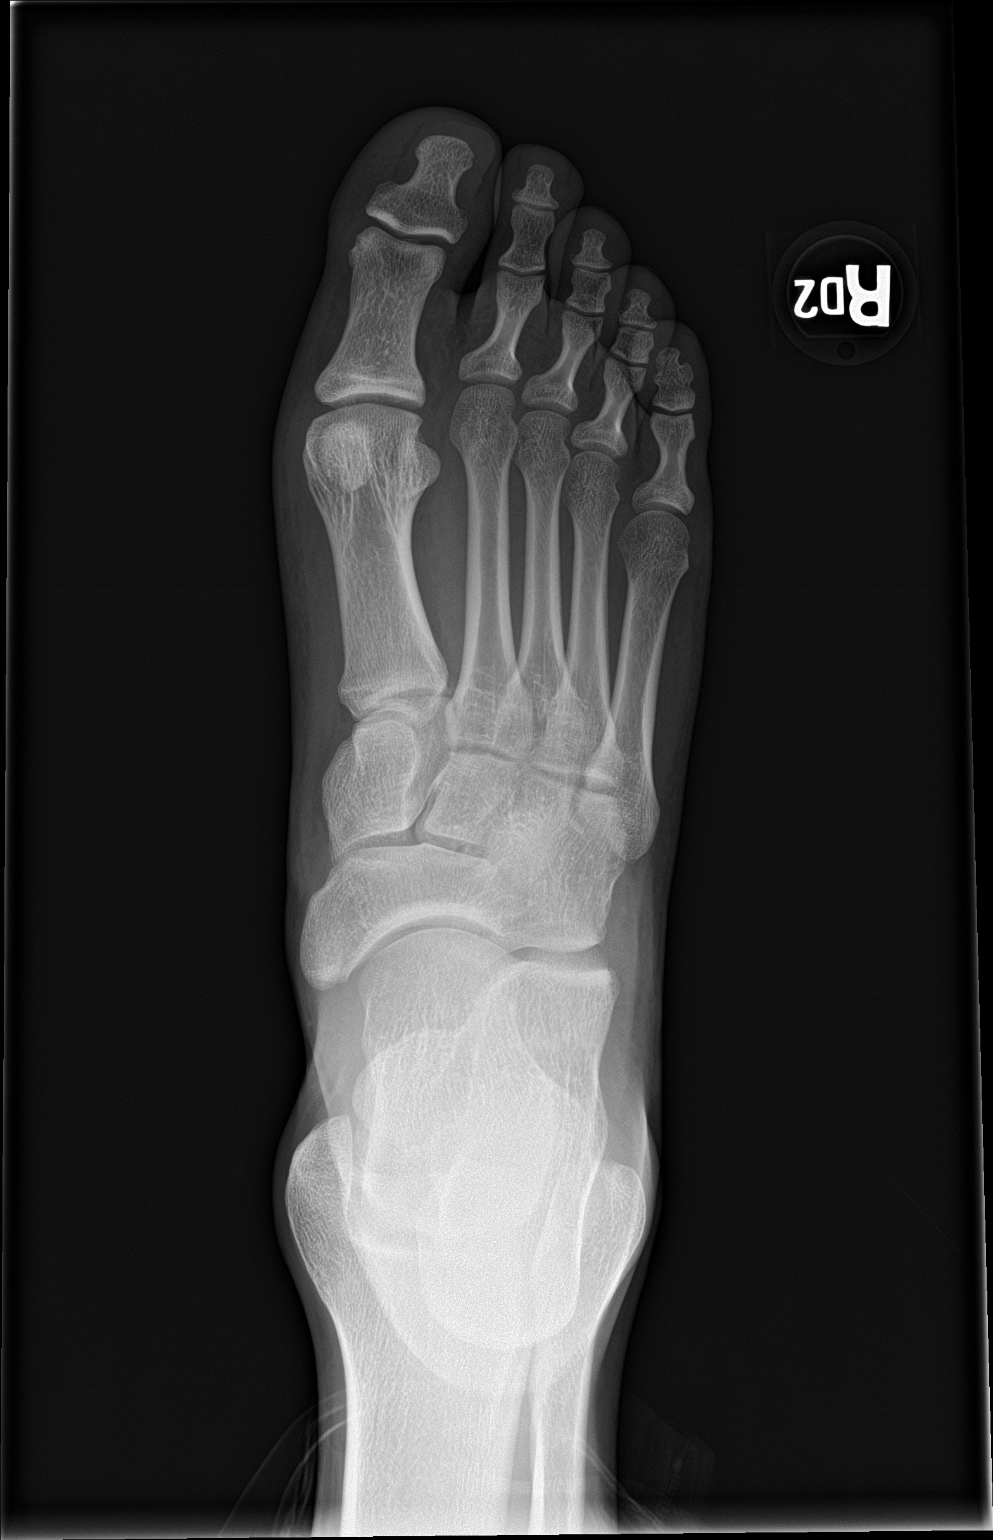

[foot obl]
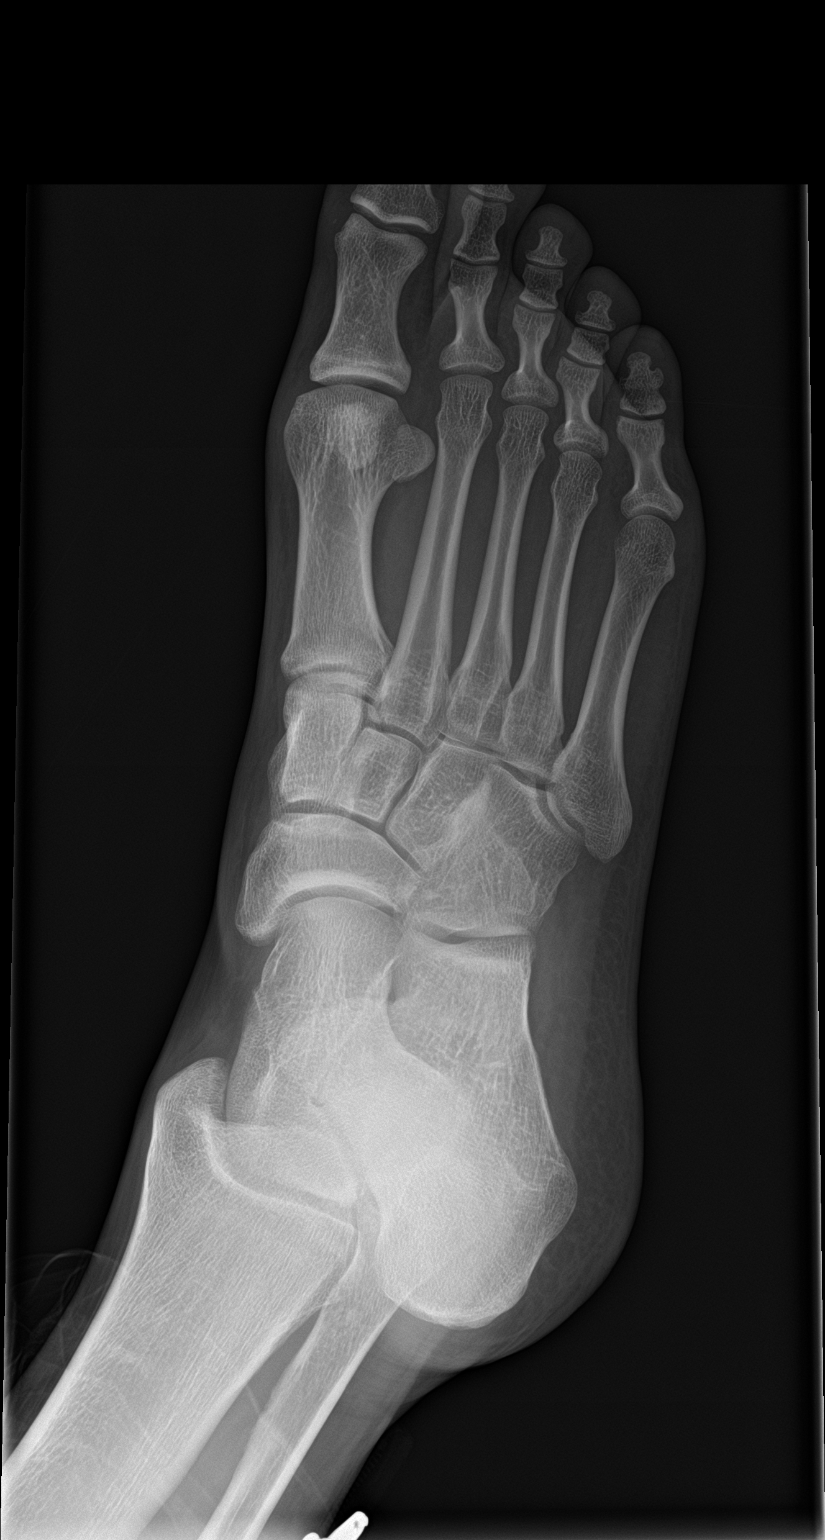

[foot lat]
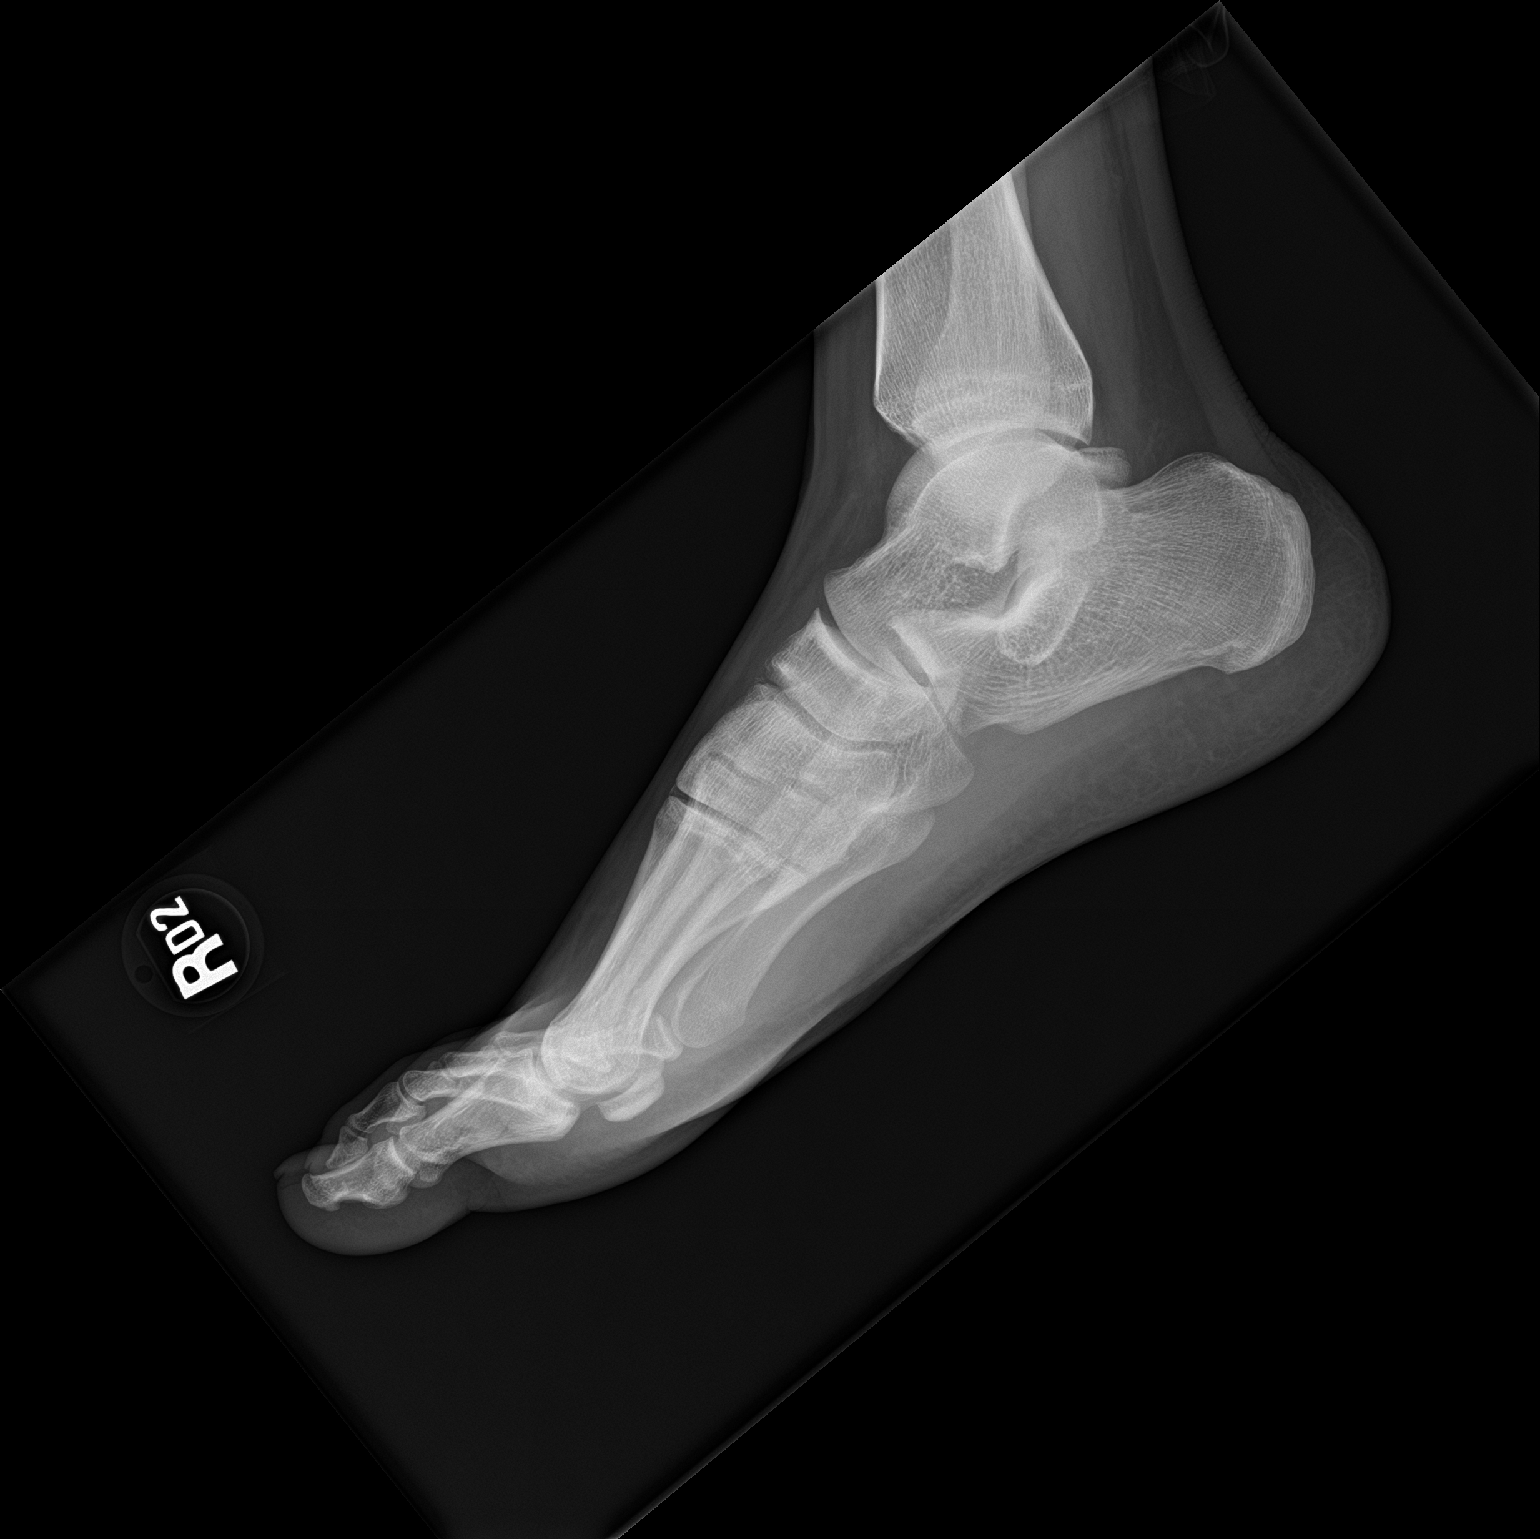

[3 of 3 positions shown; findings below may reference images not displayed]

FINDINGS: There is no evidence of fracture or dislocation. There is no
evidence of arthropathy or other focal bone abnormality. Soft
tissues are unremarkable.
IMPRESSION: Negative.

## 2022-10-22 ENCOUNTER — Encounter (HOSPITAL_COMMUNITY): Payer: Self-pay

## 2022-10-22 ENCOUNTER — Emergency Department (HOSPITAL_COMMUNITY)
Admission: EM | Admit: 2022-10-22 | Discharge: 2022-10-23 | Disposition: A | Discharge status: Home / Self Care | Payer: Medicaid Other | Attending: Emergency Medicine | Admitting: Emergency Medicine

## 2022-10-22 ENCOUNTER — Other Ambulatory Visit: Payer: Self-pay

## 2022-10-22 DIAGNOSIS — S01411A Laceration without foreign body of right cheek and temporomandibular area, initial encounter: Secondary | ICD-10-CM | POA: Insufficient documentation

## 2022-10-22 DIAGNOSIS — W540XXA Bitten by dog, initial encounter: Secondary | ICD-10-CM | POA: Diagnosis not present

## 2022-10-22 DIAGNOSIS — Z23 Encounter for immunization: Secondary | ICD-10-CM | POA: Insufficient documentation

## 2022-10-22 DIAGNOSIS — S0185XA Open bite of other part of head, initial encounter: Secondary | ICD-10-CM

## 2022-10-22 DIAGNOSIS — S0993XA Unspecified injury of face, initial encounter: Secondary | ICD-10-CM | POA: Diagnosis present

## 2022-10-22 LAB — CBC
HCT: 51.7 % (ref 39.0–52.0)
Hemoglobin: 17.2 g/dL — ABNORMAL HIGH (ref 13.0–17.0)
MCH: 30.1 pg (ref 26.0–34.0)
MCHC: 33.3 g/dL (ref 30.0–36.0)
MCV: 90.5 fL (ref 80.0–100.0)
Platelets: 238 10*3/uL (ref 150–400)
RBC: 5.71 MIL/uL (ref 4.22–5.81)
RDW: 11.8 % (ref 11.5–15.5)
WBC: 6.2 10*3/uL (ref 4.0–10.5)
nRBC: 0 % (ref 0.0–0.2)

## 2022-10-22 LAB — BASIC METABOLIC PANEL
Anion gap: 8 (ref 5–15)
BUN: 9 mg/dL (ref 6–20)
CO2: 26 mmol/L (ref 22–32)
Calcium: 9.7 mg/dL (ref 8.9–10.3)
Chloride: 102 mmol/L (ref 98–111)
Creatinine, Ser: 1.05 mg/dL (ref 0.61–1.24)
GFR, Estimated: >60 mL/min (ref >60)
Glucose, Bld: 88 mg/dL (ref 70–99)
Potassium: 3.8 mmol/L (ref 3.5–5.1)
Sodium: 136 mmol/L (ref 135–145)

## 2022-10-22 MED ORDER — IBUPROFEN 400 MG PO TABS
400.0000 mg | ORAL_TABLET | Freq: Once | ORAL | Status: AC | PRN
  Administered 2022-10-22: 400 mg via ORAL
  Filled 2022-10-22: qty 1

## 2022-10-22 MED ORDER — LIDOCAINE-EPINEPHRINE (PF) 2 %-1:200000 IJ SOLN
20.0000 mL | Freq: Once | INTRAMUSCULAR | Status: AC
  Administered 2022-10-22: 20 mL
  Filled 2022-10-22: qty 20

## 2022-10-22 MED ORDER — AMOXICILLIN-POT CLAVULANATE 875-125 MG PO TABS: 1.0000 | ORAL_TABLET | Freq: Two times a day (BID) | ORAL | 0 refills | Status: DC

## 2022-10-22 MED ORDER — AMOXICILLIN-POT CLAVULANATE 875-125 MG PO TABS
1.0000 | ORAL_TABLET | Freq: Once | ORAL | Status: AC
Start: 2022-10-22 — End: 2022-10-22
  Administered 2022-10-22: 1 via ORAL
  Filled 2022-10-22: qty 1

## 2022-10-22 MED ORDER — RABIES IMMUNE GLOBULIN 150 UNIT/ML IM INJ: 20.0000 [IU]/kg | INJECTION | Freq: Once | INTRAMUSCULAR | Status: DC

## 2022-10-22 MED ORDER — TETANUS-DIPHTH-ACELL PERTUSSIS 5-2.5-18.5 LF-MCG/0.5 IM SUSY
0.5000 mL | PREFILLED_SYRINGE | Freq: Once | INTRAMUSCULAR | Status: AC
  Administered 2022-10-22: 0.5 mL via INTRAMUSCULAR
  Filled 2022-10-22: qty 0.5

## 2022-10-22 MED ORDER — RABIES VACCINE, PCEC IM SUSR: 1.0000 mL | Freq: Once | INTRAMUSCULAR | Status: DC

## 2022-10-22 NOTE — ED Provider Notes (Signed)
[facility] EMERGENCY DEPARTMENT AT [facility] Provider Note   CSN: [id number] Arrival date & time: 10/22/22  1954     History  Chief Complaint  Patient presents with   Animal Bite    Ryan Hodges is a 27 y.o. male presenting with dog bite that occurred 1 hr ago, pt reports that it is his dog and the dog has received all vaccinations. Reports dog got initial vaccination for rabies, but was over three and a half years ago. Reports mild pain at site of bite with bleeding. Bleeding stopped after EMS covered wound. Did not report recent Tdap shot, so patient received shot. Denies other sx. Denies pmhx, does not report taking medications. Denies allergies.    Animal Bite      Home Medications Prior to Admission medications   Medication Sig Start Date End Date Taking? Authorizing Provider  ibuprofen (ADVIL) 800 MG tablet Take 1 tablet (800 mg total) by mouth every 6 (six) hours as needed for moderate pain. 07/06/21   Gilda Crease, MD  naproxen (NAPROSYN) 500 MG tablet Take 1 tablet (500 mg total) by mouth 2 (two) times daily. 02/09/20   Eber Hong, MD      Allergies    Patient has no known allergies.    Review of Systems   Review of Systems  Constitutional: Negative.   HENT: Negative.    Respiratory: Negative.    Cardiovascular: Negative.   Skin:  Positive for wound.    Physical Exam Updated Vital Signs BP 121/69 (BP Location: Left Arm)   Pulse 72   Temp 98.2 F (36.8 C)   Resp 19   Ht 6' (1.829 m)   Wt 77.1 kg   SpO2 100%   BMI 23.06 kg/m  Physical Exam HENT:     Head: Laceration present.     Jaw: No trismus, tenderness, swelling or pain on movement.     Comments: Three lacerations  left side under chin, puncture wound  Right cheek x2 Cardiovascular:     Rate and Rhythm: Normal rate and regular rhythm.  Pulmonary:     Effort: No respiratory distress.  Chest:     Chest wall: No tenderness.     ED Results / Procedures /  Treatments   Labs (all labs ordered are listed, but only abnormal results are displayed) Labs Reviewed  CBC - Abnormal; Notable for the following components:      Result Value   Hemoglobin 17.2 (*)    All other components within normal limits  BASIC METABOLIC PANEL    EKG None  Radiology No results found.  Procedures .Marland KitchenLaceration Repair  Date/Time: 10/22/2022 10:20 PM  Performed by: Smitty Knudsen, PA-C Authorized by: Smitty Knudsen, PA-C   Consent:    Consent obtained:  Verbal   Consent given by:  Patient   Risks, benefits, and alternatives were discussed: yes     Risks discussed:  Infection, pain, retained foreign body, tendon damage, vascular damage, poor wound healing, poor cosmetic result, need for additional repair and nerve damage   Alternatives discussed:  No treatment, delayed treatment, observation and referral Universal protocol:    Procedure explained and questions answered to patient or proxy's satisfaction: yes     Relevant documents present and verified: yes     Test results available: yes     Imaging studies available: yes     Required blood products, implants, devices, and special equipment available: yes     Site/side marked:  yes     Immediately prior to procedure, a time out was called: yes     Patient identity confirmed:  Verbally with patient Anesthesia:    Anesthesia method:  Local infiltration   Local anesthetic:  Lidocaine 1% WITH epi Laceration details:    Location:  Face   Face location:  R cheek   Length (cm):  2   Depth (mm):  5 Pre-procedure details:    Preparation:  Patient was prepped and draped in usual sterile fashion and imaging obtained to evaluate for foreign bodies Treatment:    Area cleansed with:  Povidone-iodine   Amount of cleaning:  Extensive   Irrigation solution:  Sterile saline   Irrigation method:  Pressure wash Skin repair:    Repair method:  Sutures   Suture size:  5-0   Suture material:  Nylon   Suture  technique:  Simple interrupted   Number of sutures:  6 Approximation:    Approximation:  Close .Marland KitchenLaceration Repair  Date/Time: 10/22/2022 11:35 PM  Performed by: Smitty Knudsen, PA-C Authorized by: Smitty Knudsen, PA-C   Consent:    Consent obtained:  Verbal   Consent given by:  Patient   Risks, benefits, and alternatives were discussed: yes     Risks discussed:  Infection, pain, poor cosmetic result, retained foreign body, tendon damage, vascular damage, poor wound healing, nerve damage and need for additional repair Universal protocol:    Procedure explained and questions answered to patient or proxy's satisfaction: yes     Relevant documents present and verified: yes     Test results available: yes     Imaging studies available: yes     Required blood products, implants, devices, and special equipment available: yes     Site/side marked: yes     Immediately prior to procedure, a time out was called: yes     Patient identity confirmed:  Verbally with patient Anesthesia:    Anesthesia method:  Local infiltration   Local anesthetic:  Lidocaine 1% WITH epi Laceration details:    Location:  Face   Face location:  R cheek   Length (cm):  1   Depth (mm):  5 Pre-procedure details:    Preparation:  Patient was prepped and draped in usual sterile fashion Treatment:    Area cleansed with:  Povidone-iodine   Amount of cleaning:  Standard   Irrigation solution:  Sterile saline   Irrigation method:  Pressure wash Skin repair:    Repair method:  Sutures   Suture size:  5-0   Suture material:  Nylon   Suture technique:  Simple interrupted Approximation:    Approximation:  Close Repair type:    Repair type:  Simple     Medications Ordered in ED Medications  ibuprofen (ADVIL) tablet 400 mg (400 mg Oral Given 10/22/22 2010)  Tdap (BOOSTRIX) injection 0.5 mL (0.5 mLs Intramuscular Given 10/22/22 2043)    ED Course/ Medical Decision Making/ A&P   This patient presents to the ED  for concern of dog bite. Discussed risk v benefit of suturing animal bite, patient agreed to laceration repair of 2 lacerations on right cheek.   this involves an extensive number of treatment options, and is a complaint that carries with it a high risk of complications and morbidity.  The differential diagnosis includes dog bite with laceration.    Co morbidities that complicate the patient evaluation  No reported PMHx   Additional history obtained:  Additional history obtained from: none.  Lab Tests:  I Ordered, and personally interpreted labs.  The pertinent results include:  CBC unremarkable  BMP unremarkable    Imaging Studies ordered:  No imaging obtained during stay. Wound was irrigated and no FB found in wound.     Cardiac Monitoring: / EKG:  The patient was not maintained on a cardiac monitor. Regular rate and rhythm    Consultations Obtained:  I requested consultation with no one.   Pt spoke with clinical pharmacist on risk vs benefits of receiving rabies vaccinations. Pt made decision on whether he would like to observe dog over period of 10 days or get vaccination. Pt states he understands risks of declining vaccination and will continue with observation of dog.    Problem List / ED Course / Critical interventions / Medication management  Laceration from dog bite I ordered medication including lido w/ epi to numb the area prior to sutures. Pt received ibuprofen during stay.  Reevaluation of the patient after these medicines showed that the patient improved I have reviewed the patients home medicines and have made adjustments as needed   Social Determinants of Health:  Pt reports he has not recently seen PCP.    Test / Admission - Considered:  After speaking with patient and discussing ROS and PE findings with the patient, I do not feel that this patient needs further imaging. Carefully cleaned wound to search for FB.                                      Medical Decision Making Amount and/or Complexity of Data Reviewed Labs: ordered.  Risk Prescription drug management.   Okay to d/c with Augmentin abx prophylaxis. Pt was given one dose today in ER.          Final Clinical Impression(s) / ED Diagnoses Final diagnoses:  None    Rx / DC Orders ED Discharge Orders     None         Smitty Knudsen, PA-C 10/22/22 2356    Lonell Grandchild, MD 10/31/22 [phone]

## 2022-10-22 NOTE — ED Notes (Signed)
Animal control notified by pt prior to arrival and dog has been taken by animal control.

## 2022-10-22 NOTE — Discharge Instructions (Addendum)
You were seen today for dog bite. The laceration was cleaned, numbed, and repaired. You have a total of 9 sutures.   Gently wash the area with warm soapy water for cleaning. Alternate tylenol and ibuprofen if needed for pain.   Return in 5-7days to have your stitches removed. You can go to any urgent care or call your primary care provider to set up an appointment.   Take your antibiotics (Augmentin) as prescribed.  Return  to ER if signs of infection or worsening symptoms.  Please continue to monitor your dog for changes in symptoms and contact the health department if needed.

## 2022-10-22 NOTE — ED Provider Triage Note (Signed)
Emergency Medicine Provider Triage Evaluation Note  Ryan Hodges , a 27 y.o. male  was evaluated in triage.  Pt complains of animal bite onset 1 hour ago PTA. Notes that he was cleaning up a pee spot on the floor from his dog when his dog began to growl at him. He bucked at his dog and his dog then lunged toward him and bite/scratched him. His dog is UTD with his vaccines. He is unsure of his tdap status. Denies shortness of breath.  Review of Systems  Positive:  Negative:   Physical Exam  BP 121/69 (BP Location: Left Arm)   Pulse 72   Temp 98.2 F (36.8 C)   Resp 19   Ht 6' (1.829 m)   Wt 77.1 kg   SpO2 100%   BMI 23.06 kg/m  Gen:   Awake, no distress   Resp:  Normal effort  MSK:   Moves extremities without difficulty  Other:  Excoriation marks noted to left lateral neck under jaw. Approximately 2 cm laceration noted to left cheek with .05 cm laceration noted lateral to lip on left side. Laceration is not through and through. Uvula midline without swelling. No posterior pharyngeal erythema or tonsillar exudate noted. Patent airway. Pt able to speak in clear complete sentences. Tolerating oral secretions.  Medical Decision Making  Medically screening exam initiated at 8:39 PM.  Appropriate orders placed.  Braulio Conte was informed that the remainder of the evaluation will be completed by another provider, this initial triage assessment does not replace that evaluation, and the importance of remaining in the ED until their evaluation is complete.  Work up initiated.    Gerad Cornelio A, PA-C 10/22/22 2041

## 2022-10-22 NOTE — ED Triage Notes (Signed)
Patient arrives via EMS with a dog bite tot he right side of cheek. Patient also has puncture wounds under the chin. 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 No blood thinners.

## 2022-10-29 ENCOUNTER — Other Ambulatory Visit: Payer: Self-pay

## 2022-10-29 ENCOUNTER — Ambulatory Visit
Admission: EM | Admit: 2022-10-29 | Discharge: 2022-10-29 | Disposition: A | Discharge status: Home / Self Care | Payer: Medicaid Other

## 2022-10-29 ENCOUNTER — Encounter: Payer: Self-pay | Admitting: Emergency Medicine

## 2022-10-29 DIAGNOSIS — S0181XD Laceration without foreign body of other part of head, subsequent encounter: Secondary | ICD-10-CM

## 2022-10-29 NOTE — ED Triage Notes (Signed)
Pt here for suture removal to face after dog bite to face 1 week ago; wound well healed at present

## 2022-10-29 NOTE — ED Provider Notes (Signed)
[facility]    CSN: [id number] Arrival date & time: 10/29/22  1505      History   Chief Complaint Chief Complaint  Patient presents with   Suture / Staple Removal    HPI Ryan Hodges is a 27 y.o. male.   Patient here today for suture removal. He denies any concerns with same. Has not had any drainage from wounds and denies swelling or any other concerns.   The history is provided by the patient.  Suture / Staple Removal    History reviewed. No pertinent past medical history.  Patient Active Problem List   Diagnosis Date Noted   ALLERGIC RHINITIS, SEASONAL 06/29/2008   OBESITY, NOS 05/15/2006    Past Surgical History:  Procedure Laterality Date   APPENDECTOMY     APPENDECTOMY     WISDOM TOOTH EXTRACTION         Home Medications    Prior to Admission medications   Medication Sig Start Date End Date Taking? Authorizing Provider  amoxicillin-clavulanate (AUGMENTIN) 875-125 MG tablet Take 1 tablet by mouth every 12 (twelve) hours. 10/22/22   Barrett, Horald Chestnut, PA-C  ibuprofen (ADVIL) 800 MG tablet Take 1 tablet (800 mg total) by mouth every 6 (six) hours as needed for moderate pain. 07/06/21   Gilda Crease, MD  naproxen (NAPROSYN) 500 MG tablet Take 1 tablet (500 mg total) by mouth 2 (two) times daily. 02/09/20   Eber Hong, MD    Family History History reviewed. No pertinent family history.  Social History Social History   Tobacco Use   Smoking status: Passive Smoke Exposure - Never Smoker   Smokeless tobacco: Never  Substance Use Topics   Alcohol use: Yes    Comment: friday nights   Drug use: Yes    Types: Marijuana     Allergies   Patient has no known allergies.   Review of Systems Review of Systems  Constitutional:  Negative for chills and fever.  Eyes:  Negative for discharge and redness.  Skin:  Positive for wound. Negative for color change.  Neurological:  Negative for numbness.     Physical Exam Triage  Vital Signs ED Triage Vitals [10/29/22 1519]  Encounter Vitals Group     BP      Systolic BP Percentile      Diastolic BP Percentile      Pulse      Resp      Temp      Temp src      SpO2      Weight      Height      Head Circumference      Peak Flow      Pain Score 0     Pain Loc      Pain Education      Exclude from Growth Chart    No data found.  Updated Vital Signs There were no vitals taken for this visit.     Physical Exam Vitals and nursing note reviewed.  Constitutional:      General: He is not in acute distress.    Appearance: Normal appearance. He is not ill-appearing.  HENT:     Head: Normocephalic and atraumatic.  Eyes:     Conjunctiva/sclera: Conjunctivae normal.  Cardiovascular:     Rate and Rhythm: Normal rate.  Pulmonary:     Effort: Pulmonary effort is normal. No respiratory distress.  Skin:    Comments: 2 lacerations to right side of face -  lower with 6 sutures intact, upper with 3 intact sutures. Both wound edges well approximated, no bleeding, drainage or swelling  Neurological:     Mental Status: He is alert.  Psychiatric:        Mood and Affect: Mood normal.        Behavior: Behavior normal.        Thought Content: Thought content normal.      UC Treatments / Results  Labs (all labs ordered are listed, but only abnormal results are displayed) Labs Reviewed - No data to display  EKG   Radiology No results found.  Procedures Procedures (including critical care time)  Medications Ordered in UC Medications - No data to display  Initial Impression / Assessment and Plan / UC Course  I have reviewed the triage vital signs and the nursing notes.  Pertinent labs & imaging results that were available during my care of the patient were reviewed by me and considered in my medical decision making (see chart for details).    Sutures removed without complication.  Final Clinical Impressions(s) / UC Diagnoses   Final diagnoses:   Facial laceration, subsequent encounter   Discharge Instructions   None    ED Prescriptions   None    PDMP not reviewed this encounter.   Tomi Bamberger, PA-C 10/29/22 1644
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
## 2022-12-12 ENCOUNTER — Ambulatory Visit
Admission: EM | Admit: 2022-12-12 | Discharge: 2022-12-12 | Disposition: A | Discharge status: Home / Self Care | Payer: Medicaid Other | Attending: Internal Medicine | Admitting: Internal Medicine

## 2022-12-12 DIAGNOSIS — K529 Noninfective gastroenteritis and colitis, unspecified: Secondary | ICD-10-CM

## 2022-12-12 MED ORDER — ONDANSETRON 4 MG PO TBDP: 4.0000 mg | ORAL_TABLET | Freq: Three times a day (TID) | ORAL | 0 refills | Status: AC | PRN

## 2022-12-12 NOTE — ED Triage Notes (Signed)
Pt presents to UC w/ c/o abd pain and diarrhea starting this morning. Ate eggs yesterday morning. Pt states he took meds "to help w/ diarrhea." Pt requests doctor's note for work.

## 2022-12-12 NOTE — ED Provider Notes (Signed)
[facility]    CSN: [id number] Arrival date & time: 12/12/22  1952      History   Chief Complaint No chief complaint on file.   HPI Ryan Hodges is a 27 y.o. male presents for nausea vomiting and diarrhea.  Patient reports 1 to 2 days of nausea with intermittent vomiting and nonbloody diarrhea.  Reports occasional abdominal cramping but denies any fevers, vomiting, URI symptoms, body aches.  States it began after lifting some eggs at home.  No known sick contacts.  No history of GI diagnoses in the past such as Crohn's, IBS, colitis.  He states he is able to stay hydrated without issue.  Decreased appetite.  He primarily needs a work note for today.  He has been taken Tylenol and Imodium.  No other concerns at this time.  HPI  History reviewed. No pertinent past medical history.  Patient Active Problem List   Diagnosis Date Noted   ALLERGIC RHINITIS, SEASONAL 06/29/2008   OBESITY, NOS 05/15/2006    Past Surgical History:  Procedure Laterality Date   APPENDECTOMY     APPENDECTOMY     WISDOM TOOTH EXTRACTION         Home Medications    Prior to Admission medications   Medication Sig Start Date End Date Taking? Authorizing Provider  ondansetron (ZOFRAN-ODT) 4 MG disintegrating tablet Take 1 tablet (4 mg total) by mouth every 8 (eight) hours as needed for nausea or vomiting. 12/12/22  Yes Radford Pax, NP  amoxicillin-clavulanate (AUGMENTIN) 875-125 MG tablet Take 1 tablet by mouth every 12 (twelve) hours. 10/22/22   Barrett, Horald Chestnut, PA-C  ibuprofen (ADVIL) 800 MG tablet Take 1 tablet (800 mg total) by mouth every 6 (six) hours as needed for moderate pain. 07/06/21   Gilda Crease, MD  naproxen (NAPROSYN) 500 MG tablet Take 1 tablet (500 mg total) by mouth 2 (two) times daily. 02/09/20   Eber Hong, MD    Family History History reviewed. No pertinent family history.  Social History Social History   Tobacco Use   Smoking status: Passive Smoke  Exposure - Never Smoker   Smokeless tobacco: Never  Vaping Use   Vaping status: Every Day  Substance Use Topics   Alcohol use: Yes    Comment: friday nights   Drug use: Yes    Types: Marijuana     Allergies   Patient has no known allergies.   Review of Systems Review of Systems  Gastrointestinal:  Positive for diarrhea, nausea and vomiting.     Physical Exam Triage Vital Signs ED Triage Vitals  Encounter Vitals Group     BP 12/12/22 1957 116/69     Systolic BP Percentile --      Diastolic BP Percentile --      Pulse Rate 12/12/22 1957 65     Resp 12/12/22 1957 16     Temp 12/12/22 1957 98.3 F (36.8 C)     Temp Source 12/12/22 1957 Oral     SpO2 12/12/22 1957 98 %     Weight --      Height --      Head Circumference --      Peak Flow --      Pain Score 12/12/22 1956 7     Pain Loc --      Pain Education --      Exclude from Growth Chart --    No data found.  Updated Vital Signs BP 116/69 (BP Location:  Right Arm)   Pulse 65   Temp 98.3 F (36.8 C) (Oral)   Resp 16   SpO2 98%   Visual Acuity Right Eye Distance:   Left Eye Distance:   Bilateral Distance:    Right Eye Near:   Left Eye Near:    Bilateral Near:     Physical Exam Vitals and nursing note reviewed.  Constitutional:      General: He is not in acute distress.    Appearance: Normal appearance. He is not ill-appearing or toxic-appearing.  HENT:     Head: Normocephalic and atraumatic.     Right Ear: Tympanic membrane and ear canal normal.     Left Ear: Tympanic membrane and ear canal normal.     Nose: No congestion.     Mouth/Throat:     Mouth: Mucous membranes are moist.     Pharynx: No posterior oropharyngeal erythema.  Eyes:     Pupils: Pupils are equal, round, and reactive to light.  Cardiovascular:     Rate and Rhythm: Normal rate and regular rhythm.     Heart sounds: Normal heart sounds.  Pulmonary:     Effort: Pulmonary effort is normal.     Breath sounds: Normal breath  sounds.  Abdominal:     General: Bowel sounds are normal. There is no distension.     Palpations: Abdomen is soft. There is no mass.     Tenderness: There is no abdominal tenderness. There is no guarding or rebound.  Musculoskeletal:     Cervical back: Normal range of motion and neck supple.  Lymphadenopathy:     Cervical: No cervical adenopathy.  Skin:    General: Skin is warm and dry.  Neurological:     General: No focal deficit present.     Mental Status: He is alert and oriented to person, place, and time.  Psychiatric:        Mood and Affect: Mood normal.        Behavior: Behavior normal.      UC Treatments / Results  Labs (all labs ordered are listed, but only abnormal results are displayed) Labs Reviewed - No data to display  EKG   Radiology No results found.  Procedures Procedures (including critical care time)  Medications Ordered in UC Medications - No data to display  Initial Impression / Assessment and Plan / UC Course  I have reviewed the triage vital signs and the nursing notes.  Pertinent labs & imaging results that were available during my care of the patient were reviewed by me and considered in my medical decision making (see chart for details).     Reviewed exam and symptoms with patient.  No red flags.  He is well-appearing and in no acute distress.  He declined COVID testing work note provided.  Zofran as needed for nausea.  Discussed viral illness and symptomatic treatment.  Encourage fluids/electrolyte replacement.  PCP follow-up if symptoms do not improve.  ER precautions reviewed and patient verbalized understanding. Final Clinical Impressions(s) / UC Diagnoses   Final diagnoses:  Gastroenteritis     Discharge Instructions      You may take Zofran as needed for nausea or vomiting.  Lots of rest and fluids.  Please follow-up with your PCP if your symptoms do not improve.  Please go to ER if you develop any worsening symptoms.  I hope you  feel better soon!    ED Prescriptions     Medication Sig Dispense Auth. Provider  ondansetron (ZOFRAN-ODT) 4 MG disintegrating tablet Take 1 tablet (4 mg total) by mouth every 8 (eight) hours as needed for nausea or vomiting. 10 tablet Radford Pax, NP      PDMP not reviewed this encounter.   Radford Pax, NP 12/12/22 2007

## 2022-12-12 NOTE — Discharge Instructions (Signed)
You may take Zofran as needed for nausea or vomiting.  Lots of rest and fluids.  Please follow-up with your PCP if your symptoms do not improve.  Please go to ER if you develop any worsening symptoms.  I hope you feel better soon!

## 2023-03-05 ENCOUNTER — Encounter (HOSPITAL_BASED_OUTPATIENT_CLINIC_OR_DEPARTMENT_OTHER): Payer: Self-pay

## 2023-03-05 ENCOUNTER — Emergency Department (HOSPITAL_BASED_OUTPATIENT_CLINIC_OR_DEPARTMENT_OTHER)
Admission: EM | Admit: 2023-03-05 | Discharge: 2023-03-05 | Disposition: A | Discharge status: Home / Self Care | Payer: 59 | Attending: Emergency Medicine | Admitting: Emergency Medicine

## 2023-03-05 ENCOUNTER — Other Ambulatory Visit: Payer: Self-pay

## 2023-03-05 ENCOUNTER — Emergency Department (HOSPITAL_BASED_OUTPATIENT_CLINIC_OR_DEPARTMENT_OTHER): Payer: 59 | Admitting: Radiology

## 2023-03-05 DIAGNOSIS — W540XXA Bitten by dog, initial encounter: Secondary | ICD-10-CM | POA: Insufficient documentation

## 2023-03-05 DIAGNOSIS — S51851A Open bite of right forearm, initial encounter: Secondary | ICD-10-CM | POA: Diagnosis not present

## 2023-03-05 DIAGNOSIS — M7989 Other specified soft tissue disorders: Secondary | ICD-10-CM | POA: Diagnosis not present

## 2023-03-05 MED ORDER — AMOXICILLIN-POT CLAVULANATE 875-125 MG PO TABS
1.0000 | ORAL_TABLET | Freq: Once | ORAL | Status: AC
  Administered 2023-03-05: 1 via ORAL
  Filled 2023-03-05: qty 1

## 2023-03-05 MED ORDER — OXYCODONE-ACETAMINOPHEN 5-325 MG PO TABS
1.0000 | ORAL_TABLET | ORAL | Status: DC | PRN
Start: 2023-03-05 — End: 2023-03-05
  Administered 2023-03-05: 1 via ORAL
  Filled 2023-03-05: qty 1

## 2023-03-05 MED ORDER — AMOXICILLIN-POT CLAVULANATE 875-125 MG PO TABS
1.0000 | ORAL_TABLET | Freq: Two times a day (BID) | ORAL | 0 refills | Status: AC
Start: 2023-03-05 — End: ?

## 2023-03-05 NOTE — ED Notes (Signed)
Wound cleansed with saline and re-wrapped in triage.

## 2023-03-05 NOTE — ED Triage Notes (Signed)
Multiple puncture marks noted, no active bleeding. No lacerations or tears noted. Swelling noted in right wrist/forearm.

## 2023-03-05 NOTE — ED Triage Notes (Signed)
Pt coming in with multiple dog bites to right hand/wrist/forearm. EMS came to house to evaluate and wrapped patient's wrist and forearm. Bleeding controlled in triage. Pt states it was his dog who bit him and that his dog is up to date on shots.

## 2023-03-05 NOTE — ED Provider Notes (Signed)
[place] EMERGENCY DEPARTMENT AT [facility]  Provider Note  CSN: [id number] Arrival date & time: 03/05/23 0151  History Chief Complaint  Patient presents with   Animal Bite    Ryan Hodges is a 27 y.o. male reports he was bitten by his own dog on R hand earlier tonight. Dog is UTD on Rabies, patient is UTD on TDAP.    Home Medications Prior to Admission medications   Medication Sig Start Date End Date Taking? Authorizing Provider  amoxicillin-clavulanate (AUGMENTIN) 875-125 MG tablet Take 1 tablet by mouth every 12 (twelve) hours. 03/05/23  Yes Pollyann Savoy, MD  ibuprofen (ADVIL) 800 MG tablet Take 1 tablet (800 mg total) by mouth every 6 (six) hours as needed for moderate pain. 07/06/21   Gilda Crease, MD  naproxen (NAPROSYN) 500 MG tablet Take 1 tablet (500 mg total) by mouth 2 (two) times daily. 02/09/20   Eber Hong, MD  ondansetron (ZOFRAN-ODT) 4 MG disintegrating tablet Take 1 tablet (4 mg total) by mouth every 8 (eight) hours as needed for nausea or vomiting. 12/12/22   Radford Pax, NP     Allergies    Patient has no known allergies.   Review of Systems   Review of Systems Please see HPI for pertinent positives and negatives  Physical Exam BP 105/73 (BP Location: Left Arm)   Pulse 80   Temp 98.9 F (37.2 C)   Resp 16   Ht 6' (1.829 m)   Wt 86.2 kg   SpO2 100%   BMI 25.77 kg/m   Physical Exam Vitals and nursing note reviewed.  HENT:     Head: Normocephalic.     Nose: Nose normal.  Eyes:     Extraocular Movements: Extraocular movements intact.  Pulmonary:     Effort: Pulmonary effort is normal.  Musculoskeletal:        General: Normal range of motion.     Cervical back: Neck supple.     Comments: Multiple puncture wounds to R forearm and hand. Oozing small amount of blood from soft tissue hematoma; normal neurovascular.   Skin:    Findings: No rash (on exposed skin).  Neurological:     Mental Status: He is alert  and oriented to person, place, and time.  Psychiatric:        Mood and Affect: Mood normal.     ED Results / Procedures / Treatments   EKG None  Procedures Procedures  Medications Ordered in the ED Medications  oxyCODONE-acetaminophen (PERCOCET/ROXICET) 5-325 MG per tablet 1 tablet (1 tablet Oral Given 03/05/23 0405)  amoxicillin-clavulanate (AUGMENTIN) 875-125 MG per tablet 1 tablet (has no administration in time range)    Initial Impression and Plan  Patient here with dog bit to R forarm/hand. I personally viewed the images from radiology studies and agree with radiologist interpretation: xrays neg for FB or Fx.  Wounds are not in need of repair. Plan wound care, augmentin. PCP follow up, RTED for any other concerns.   ED Course       MDM Rules/Calculators/A&P Medical Decision Making Problems Addressed: Dog bite of right forearm, initial encounter: acute illness or injury  Amount and/or Complexity of Data Reviewed Radiology: ordered and independent interpretation performed. Decision-making details documented in ED Course.  Risk Prescription drug management.     Final Clinical Impression(s) / ED Diagnoses Final diagnoses:  Dog bite of right forearm, initial encounter    Rx / DC Orders ED Discharge Orders  Ordered    amoxicillin-clavulanate (AUGMENTIN) 875-125 MG tablet  Every 12 hours        03/05/23 0530             Pollyann Savoy, MD 03/05/23 [phone]

## 2023-03-09 ENCOUNTER — Emergency Department (HOSPITAL_COMMUNITY)
Admission: EM | Admit: 2023-03-09 | Discharge: 2023-03-09 | Disposition: A | Discharge status: Home / Self Care | Payer: 59 | Attending: Emergency Medicine | Admitting: Emergency Medicine

## 2023-03-09 ENCOUNTER — Encounter (HOSPITAL_COMMUNITY): Payer: Self-pay

## 2023-03-09 ENCOUNTER — Other Ambulatory Visit: Payer: Self-pay

## 2023-03-09 DIAGNOSIS — M79641 Pain in right hand: Secondary | ICD-10-CM

## 2023-03-09 MED ORDER — AMOXICILLIN-POT CLAVULANATE 875-125 MG PO TABS
1.0000 | ORAL_TABLET | Freq: Once | ORAL | Status: AC
  Administered 2023-03-09: 1 via ORAL
  Filled 2023-03-09: qty 1

## 2023-03-09 NOTE — Discharge Instructions (Signed)
Finish the course of antibiotics previously prescribed to you. Follow up with the hand specialist as needed and return to the ER with any new severe symptoms.

## 2023-03-09 NOTE — ED Provider Notes (Signed)
[place] EMERGENCY DEPARTMENT AT [facility] Provider Note   CSN: [id number] Arrival date & time: 03/09/23  0001     History  Chief Complaint  Patient presents with   Hand Problem    Ryan Hodges is a 27 y.o. male patient is 4 days status post dog bite to the right hand with some numbness and tingling in the back of the hand.  Has not been taking Augmentin as prescribed at initial visit at [facility].  No fevers or chills, no purulent drainage.  HPI     Home Medications Prior to Admission medications   Medication Sig Start Date End Date Taking? Authorizing Provider  amoxicillin-clavulanate (AUGMENTIN) 875-125 MG tablet Take 1 tablet by mouth every 12 (twelve) hours. 03/05/23   Pollyann Savoy, MD  ibuprofen (ADVIL) 800 MG tablet Take 1 tablet (800 mg total) by mouth every 6 (six) hours as needed for moderate pain. 07/06/21   Gilda Crease, MD  naproxen (NAPROSYN) 500 MG tablet Take 1 tablet (500 mg total) by mouth 2 (two) times daily. 02/09/20   Eber Hong, MD  ondansetron (ZOFRAN-ODT) 4 MG disintegrating tablet Take 1 tablet (4 mg total) by mouth every 8 (eight) hours as needed for nausea or vomiting. 12/12/22   Radford Pax, NP      Allergies    Patient has no known allergies.    Review of Systems   Review of Systems  Neurological:  Positive for numbness.    Physical Exam Updated Vital Signs BP 120/75   Pulse 90   Temp 98.4 F (36.9 C) (Oral)   Resp 15   SpO2 99%  Physical Exam Vitals and nursing note reviewed.  Constitutional:      Appearance: He is not ill-appearing or toxic-appearing.  HENT:     Head: Normocephalic and atraumatic.  Eyes:     General: No scleral icterus.       Right eye: No discharge.        Left eye: No discharge.     Conjunctiva/sclera: Conjunctivae normal.  Pulmonary:     Effort: Pulmonary effort is normal.  Musculoskeletal:       Arms:  Skin:    General: Skin is warm and dry.  Neurological:      General: No focal deficit present.     Mental Status: He is alert.  Psychiatric:        Mood and Affect: Mood normal.     ED Results / Procedures / Treatments   Labs (all labs ordered are listed, but only abnormal results are displayed) Labs Reviewed - No data to display  EKG None  Radiology No results found.  Procedures Procedures    Medications Ordered in ED Medications  amoxicillin-clavulanate (AUGMENTIN) 875-125 MG per tablet 1 tablet (1 tablet Oral Given 03/09/23 0327)    ED Course/ Medical Decision Making/ A&P                                 Medical Decision Making 27 year old male with tingling in the back of the hand 4 days status post dog bite.  Vital signs normal on intake.  Physical exam reassuring without evidence of infection on wounds.  Risk Prescription drug management.   Will administer first antibiotics in the emergency department and to recommend patient pick up prescription for Augmentin in the outpatient setting as previously prescribed.  Clinical concern for emergent underlying condition that  would warrant further ED workup or inpatient management is exceedingly low.  Recommend close outpatient follow-up.  Strict return precautions given. Kentley voiced understanding of his medical evaluation and treatment plan. Each of their questions answered to their expressed satisfaction.  Return precautions were given.  Patient is well-appearing, stable, and was discharged in good condition.  This chart was dictated using voice recognition software, Dragon. Despite the best efforts of this provider to proofread and correct errors, errors may still occur which can change documentation meaning.         Final Clinical Impression(s) / ED Diagnoses Final diagnoses:  Pain of right hand    Rx / DC Orders ED Discharge Orders     None         Sherrilee Gilles 03/09/23 5409    Nira Conn, MD 03/09/23 [phone]

## 2023-03-09 NOTE — ED Triage Notes (Signed)
Pt was seen at [facility] x 4 night ago for dog bite to right forearm and hand and has had some numbness and tingling in right hand x 2 days.

## 2023-07-11 ENCOUNTER — Emergency Department (HOSPITAL_COMMUNITY)
Admission: EM | Admit: 2023-07-11 | Discharge: 2023-07-12 | Disposition: A | Discharge status: Home / Self Care | Attending: Emergency Medicine | Admitting: Emergency Medicine

## 2023-07-11 ENCOUNTER — Encounter (HOSPITAL_COMMUNITY): Payer: Self-pay

## 2023-07-11 ENCOUNTER — Other Ambulatory Visit: Payer: Self-pay

## 2023-07-11 DIAGNOSIS — R1084 Generalized abdominal pain: Secondary | ICD-10-CM

## 2023-07-11 DIAGNOSIS — R1012 Left upper quadrant pain: Secondary | ICD-10-CM | POA: Insufficient documentation

## 2023-07-11 DIAGNOSIS — R110 Nausea: Secondary | ICD-10-CM | POA: Diagnosis not present

## 2023-07-11 LAB — CBC WITH DIFFERENTIAL/PLATELET
Abs Immature Granulocytes: 0.03 10*3/uL (ref 0.00–0.07)
Basophils Absolute: 0 10*3/uL (ref 0.0–0.1)
Basophils Relative: 0 %
Eosinophils Absolute: 0.2 10*3/uL (ref 0.0–0.5)
Eosinophils Relative: 3 %
HCT: 47.1 % (ref 39.0–52.0)
Hemoglobin: 15.9 g/dL (ref 13.0–17.0)
Immature Granulocytes: 0 %
Lymphocytes Relative: 24 %
Lymphs Abs: 1.6 10*3/uL (ref 0.7–4.0)
MCH: 30.9 pg (ref 26.0–34.0)
MCHC: 33.8 g/dL (ref 30.0–36.0)
MCV: 91.5 fL (ref 80.0–100.0)
Monocytes Absolute: 0.9 10*3/uL (ref 0.1–1.0)
Monocytes Relative: 13 %
Neutro Abs: 4 10*3/uL (ref 1.7–7.7)
Neutrophils Relative %: 60 %
Platelets: 227 10*3/uL (ref 150–400)
RBC: 5.15 MIL/uL (ref 4.22–5.81)
RDW: 11.9 % (ref 11.5–15.5)
WBC: 6.7 10*3/uL (ref 4.0–10.5)
nRBC: 0 % (ref 0.0–0.2)

## 2023-07-11 LAB — URINALYSIS, ROUTINE W REFLEX MICROSCOPIC
Bacteria, UA: NONE SEEN
Bilirubin Urine: NEGATIVE
Glucose, UA: NEGATIVE mg/dL
Hgb urine dipstick: NEGATIVE
Ketones, ur: 20 mg/dL — AB
Leukocytes,Ua: NEGATIVE
Nitrite: NEGATIVE
Protein, ur: 30 mg/dL — AB
Specific Gravity, Urine: 1.03 (ref 1.005–1.030)
pH: 5 (ref 5.0–8.0)

## 2023-07-11 LAB — COMPREHENSIVE METABOLIC PANEL WITH GFR
ALT: 26 U/L (ref 0–44)
AST: 20 U/L (ref 15–41)
Albumin: 4.5 g/dL (ref 3.5–5.0)
Alkaline Phosphatase: 68 U/L (ref 38–126)
Anion gap: 9 (ref 5–15)
BUN: 11 mg/dL (ref 6–20)
CO2: 22 mmol/L (ref 22–32)
Calcium: 9.2 mg/dL (ref 8.9–10.3)
Chloride: 105 mmol/L (ref 98–111)
Creatinine, Ser: 0.89 mg/dL (ref 0.61–1.24)
GFR, Estimated: >60 mL/min (ref >60)
Glucose, Bld: 95 mg/dL (ref 70–99)
Potassium: 3.7 mmol/L (ref 3.5–5.1)
Sodium: 136 mmol/L (ref 135–145)
Total Bilirubin: 1.2 mg/dL (ref 0.0–1.2)
Total Protein: 7.8 g/dL (ref 6.5–8.1)

## 2023-07-11 LAB — LIPASE, BLOOD: Lipase: 31 U/L (ref 11–51)

## 2023-07-11 NOTE — ED Triage Notes (Addendum)
 Pt BIB EMS from home with reports of upper central sharp abdominal pain x 6 hrs ago. Pt states that he was fishing when he felt the pain.

## 2023-07-12 MED ORDER — ALUM & MAG HYDROXIDE-SIMETH 200-200-20 MG/5ML PO SUSP
30.0000 mL | Freq: Once | ORAL | Status: AC
  Administered 2023-07-12: 30 mL via ORAL
  Filled 2023-07-12: qty 30

## 2023-07-12 MED ORDER — OMEPRAZOLE 20 MG PO CPDR: 20.0000 mg | DELAYED_RELEASE_CAPSULE | Freq: Every day | ORAL | 0 refills | Status: AC

## 2023-07-12 NOTE — ED Provider Notes (Signed)
 [facility] AT [facility] Provider Note   CSN: [id number] Arrival date & time: 07/11/23  2052     History Chief Complaint  Patient presents with   Abdominal Pain     Abdominal Pain Associated symptoms: nausea   Associated symptoms: no constipation, no diarrhea, no dysuria, no fatigue, no fever and no vomiting    Ryan Hodges is a 28 y.o. male presenting for left upper abdominal pain and central abdominal pain that began suddenly when he was fishing today.  He states that he has had reflux in the past but this is significantly worsening previous.  He did have some initial nausea and feeling of need to have a bowel movement, but denies having vomited and denies any diarrhea or loose stools.  He did have 1 normal bowel movement this morning, but does feel like he is still potentially constipated from recent travel from [place] .  He denies any radiation of the pain denies any back pain, and at present has decreased pain to the same area.  He has not taken any medications prior to arrival, and states that he had 2 tacos prior to onset of symptoms.  He denies any alcohol intake in the last week   Patient's recorded medical, surgical, social, medication list and allergies were reviewed in the Snapshot window as part of the initial history.   Review of Systems   Review of Systems  Constitutional:  Negative for appetite change, fatigue and fever.  Gastrointestinal:  Positive for abdominal pain and nausea. Negative for abdominal distention, blood in stool, constipation, diarrhea and vomiting.  Genitourinary:  Negative for difficulty urinating and dysuria.    Physical Exam Updated Vital Signs BP 126/74 (BP Location: Right Arm)   Pulse 65   Temp 97.9 F (36.6 C) (Oral)   Resp 18   Ht 6\' 1"  (1.854 m)   Wt 91.2 kg   SpO2 95%   BMI 26.52 kg/m  Physical Exam Vitals and nursing note reviewed.  Constitutional:      General: He is not in acute  distress.    Appearance: Normal appearance.  HENT:     Head: Normocephalic and atraumatic.     Mouth/Throat:     Mouth: Mucous membranes are moist.     Pharynx: Oropharynx is clear.  Eyes:     Extraocular Movements: Extraocular movements intact.     Conjunctiva/sclera: Conjunctivae normal.     Pupils: Pupils are equal, round, and reactive to light.  Cardiovascular:     Rate and Rhythm: Normal rate and regular rhythm.     Pulses: Normal pulses.     Heart sounds: Normal heart sounds. No murmur heard.    No friction rub. No gallop.  Pulmonary:     Effort: Pulmonary effort is normal.     Breath sounds: Normal breath sounds.  Abdominal:     General: Abdomen is flat. Bowel sounds are normal.     Palpations: Abdomen is soft.     Tenderness: There is abdominal tenderness in the epigastric area and left upper quadrant. There is no right CVA tenderness or left CVA tenderness. Negative signs include Murphy's sign and McBurney's sign.     Comments: Melatonin will tenderness to the epigastrium and left hypochondrium.  Musculoskeletal:        General: Normal range of motion.     Cervical back: Normal range of motion and neck supple.     Right lower leg: No edema.  Left lower leg: No edema.  Skin:    General: Skin is warm and dry.     Capillary Refill: Capillary refill takes less than 2 seconds.  Neurological:     General: No focal deficit present.     Mental Status: He is alert. Mental status is at baseline.  Psychiatric:        Mood and Affect: Mood normal.      ED Course/ Medical Decision Making/ A&P    Procedures Procedures   Medications Ordered in ED Medications  alum & mag hydroxide-simeth (MAALOX/MYLANTA) 200-200-20 MG/5ML suspension 30 mL (30 mLs Oral Given 07/12/23 0042)    Medical Decision Making:   Ryan Hodges is a 28 y.o. male who presented to the ED today with abdominal discomfort detailed above.     Complete initial physical exam performed, notably the  patient  was nontoxic in and stable, with greatly reduced pain from initial presentation.     Reviewed and confirmed nursing documentation for past medical history, family history, social history.    Initial Assessment:   With the patient's presentation of abdominal pain, most likely diagnosis is enteritis. Other diagnoses were considered including (but not limited to) cholecystitis, pancreatitis. These are considered less likely due to history of present illness and physical exam findings.     Initial Plan:  Provide GI cocktail to manage abdominal discomfort. Screening labs including CBC and Metabolic panel to evaluate for infectious or metabolic etiology of disease.  Urinalysis with reflex culture ordered to evaluate for UTI or relevant urologic/nephrologic pathology.  Prescription for PPI to manage reflux Objective evaluation as below reviewed   Initial Study Results:   Laboratory  All laboratory results reviewed without evidence of clinically relevant pathology.   Exceptions include: None       Reassessment and Plan:   Post GI cocktail patient's symptoms improved and he is able to tolerate p.o. intake without difficulty.  Will discharge with prescription for PPI as noted, follow-up with primary care noted and discussion had with patient for bland/clear liquid diet over the next 24 to 48 hours and advance as tolerated.    Clinical Impression: No diagnosis found.   Data Unavailable   Final Clinical Impression(s) / ED Diagnoses Final diagnoses:  None    Rx / DC Orders ED Discharge Orders     None         Juanetta Nordmann, PA 07/12/23 0102    Palumbo, April, MD 07/12/23 2956

## 2023-07-12 NOTE — Discharge Instructions (Signed)
 As discussed, prescription and you can take this once daily to help control acid reflux.  Over the next day to 2 days stick to clear liquid diet along with planned easy to digest foods as we discussed.  Please follow-up with your primary care should you have any further concerns or return to this department should the abdominal pain become much more severe, or you begin to have vomiting, fever, or any other concerning symptoms.
